# Patient Record
Sex: Female | Born: 1955
Health system: Southern US, Community
[De-identification: ages and names within clinical notes are randomized; demographics above are authoritative.]

## PROBLEM LIST (undated history)

## (undated) DIAGNOSIS — E785 Hyperlipidemia, unspecified: Secondary | ICD-10-CM

## (undated) DIAGNOSIS — F419 Anxiety disorder, unspecified: Secondary | ICD-10-CM

## (undated) DIAGNOSIS — H409 Unspecified glaucoma: Secondary | ICD-10-CM

## (undated) DIAGNOSIS — I10 Essential (primary) hypertension: Secondary | ICD-10-CM

## (undated) DIAGNOSIS — B019 Varicella without complication: Secondary | ICD-10-CM

## (undated) DIAGNOSIS — K259 Gastric ulcer, unspecified as acute or chronic, without hemorrhage or perforation: Secondary | ICD-10-CM

## (undated) DIAGNOSIS — Z9289 Personal history of other medical treatment: Secondary | ICD-10-CM

## (undated) HISTORY — DX: Personal history of other medical treatment: Z92.89

## (undated) HISTORY — DX: Unspecified glaucoma: H40.9

## (undated) HISTORY — DX: Essential (primary) hypertension: I10

## (undated) HISTORY — DX: Gastric ulcer, unspecified as acute or chronic, without hemorrhage or perforation: K25.9

## (undated) HISTORY — PX: PARTIAL HYSTERECTOMY: SHX80

## (undated) HISTORY — DX: Varicella without complication: B01.9

## (undated) HISTORY — DX: Hyperlipidemia, unspecified: E78.5

## (undated) HISTORY — DX: Anxiety disorder, unspecified: F41.9

---
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
## 1981-01-04 HISTORY — PX: WRIST GANGLION EXCISION: SUR520

## 2002-01-04 HISTORY — PX: VAGINAL HYSTERECTOMY: SUR661

## 2003-01-05 HISTORY — PX: HERNIA REPAIR: SHX51

## 2006-04-15 ENCOUNTER — Other Ambulatory Visit: Payer: Self-pay

## 2006-04-15 ENCOUNTER — Ambulatory Visit: Payer: Self-pay | Admitting: General Surgery

## 2006-04-21 ENCOUNTER — Ambulatory Visit: Payer: Self-pay | Admitting: General Surgery

## 2006-10-22 ENCOUNTER — Emergency Department: Payer: Self-pay | Admitting: Emergency Medicine

## 2008-10-13 ENCOUNTER — Inpatient Hospital Stay: Payer: Self-pay | Admitting: Internal Medicine

## 2009-03-18 ENCOUNTER — Ambulatory Visit: Payer: Self-pay | Admitting: Unknown Physician Specialty

## 2010-03-31 ENCOUNTER — Ambulatory Visit: Payer: Self-pay | Admitting: Family Medicine

## 2011-04-15 ENCOUNTER — Ambulatory Visit: Payer: Self-pay | Admitting: Family Medicine

## 2011-11-03 ENCOUNTER — Ambulatory Visit: Payer: Self-pay | Admitting: Family Medicine

## 2012-04-19 ENCOUNTER — Ambulatory Visit: Payer: Self-pay | Admitting: Family Medicine

## 2013-04-18 ENCOUNTER — Ambulatory Visit: Payer: Self-pay | Admitting: Family Medicine

## 2014-02-25 LAB — BASIC METABOLIC PANEL
BUN: 14 mg/dL (ref 4–21)
Creatinine: 0.9 mg/dL (ref 0.5–1.1)
Glucose: 100 mg/dL
Potassium: 4.2 mmol/L (ref 3.4–5.3)
SODIUM: 141 mmol/L (ref 137–147)

## 2014-02-25 LAB — CBC AND DIFFERENTIAL
HEMATOCRIT: 49 % — AB (ref 36–46)
HEMOGLOBIN: 16.6 g/dL — AB (ref 12.0–16.0)
NEUTROS ABS: 6 /uL
Platelets: 275 10*3/uL (ref 150–399)
WBC: 10.5 10^3/mL

## 2014-02-25 LAB — HEPATIC FUNCTION PANEL
ALT: 29 U/L (ref 7–35)
AST: 27 U/L (ref 13–35)
Alkaline Phosphatase: 78 U/L (ref 25–125)
Bilirubin, Total: 0.4 mg/dL

## 2014-02-25 LAB — LIPID PANEL
Cholesterol: 193 mg/dL (ref 0–200)
HDL: 60 mg/dL (ref 35–70)
LDL CALC: 101 mg/dL
Triglycerides: 158 mg/dL (ref 40–160)

## 2014-02-25 LAB — TSH: TSH: 0.88 u[IU]/mL (ref 0.41–5.90)

## 2014-02-25 LAB — HEMOGLOBIN A1C: Hemoglobin A1C: 6

## 2014-04-16 ENCOUNTER — Ambulatory Visit
Admit: 2014-04-16 | Disposition: A | Discharge status: Home / Self Care | Payer: Self-pay | Attending: Internal Medicine | Admitting: Internal Medicine

## 2014-04-16 LAB — CBC CANCER CENTER
BASOS ABS: 0.1 x10 3/mm (ref 0.0–0.1)
Basophil %: 0.9 %
Eosinophil #: 0.6 x10 3/mm (ref 0.0–0.7)
Eosinophil %: 4.1 %
HCT: 48.5 % — AB (ref 35.0–47.0)
HGB: 16.4 g/dL — ABNORMAL HIGH (ref 12.0–16.0)
Lymphocyte #: 4.7 x10 3/mm — ABNORMAL HIGH (ref 1.0–3.6)
Lymphocyte %: 34.5 %
MCH: 30.6 pg (ref 26.0–34.0)
MCHC: 33.8 g/dL (ref 32.0–36.0)
MCV: 90 fL (ref 80–100)
MONO ABS: 1.2 x10 3/mm — AB (ref 0.2–0.9)
Monocyte %: 8.4 %
NEUTROS PCT: 52.1 %
Neutrophil #: 7.2 x10 3/mm — ABNORMAL HIGH (ref 1.4–6.5)
PLATELETS: 269 x10 3/mm (ref 150–440)
RBC: 5.36 10*6/uL — AB (ref 3.80–5.20)
RDW: 12.5 % (ref 11.5–14.5)
WBC: 13.7 x10 3/mm — ABNORMAL HIGH (ref 3.6–11.0)

## 2014-04-16 LAB — FERRITIN: FERRITIN (ARMC): 156 ng/mL

## 2014-04-16 LAB — IRON AND TIBC
IRON BIND. CAP.(TOTAL): 477 — AB (ref 250–450)
IRON SATURATION: 14
Iron: 67 ug/dL
Unbound Iron-Bind.Cap.: 410

## 2014-04-23 LAB — CANCER CENTER HEMATOCRIT: HCT: 41.1 % (ref 35.0–47.0)

## 2014-04-24 ENCOUNTER — Ambulatory Visit
Admit: 2014-04-24 | Disposition: A | Discharge status: Home / Self Care | Payer: Self-pay | Attending: Nurse Practitioner | Admitting: Nurse Practitioner

## 2014-04-24 LAB — HM MAMMOGRAPHY: HM Mammogram: NORMAL (ref 0–4)

## 2015-03-20 ENCOUNTER — Encounter: Payer: Self-pay | Admitting: Family Medicine

## 2015-03-20 ENCOUNTER — Ambulatory Visit (INDEPENDENT_AMBULATORY_CARE_PROVIDER_SITE_OTHER): Payer: BLUE CROSS/BLUE SHIELD | Admitting: Family Medicine

## 2015-03-20 VITALS — BP 128/88 | HR 71 | Temp 97.8°F | Ht 61.0 in | Wt 137.2 lb

## 2015-03-20 DIAGNOSIS — Z9109 Other allergy status, other than to drugs and biological substances: Secondary | ICD-10-CM

## 2015-03-20 DIAGNOSIS — Z91048 Other nonmedicinal substance allergy status: Secondary | ICD-10-CM

## 2015-03-20 DIAGNOSIS — E785 Hyperlipidemia, unspecified: Secondary | ICD-10-CM

## 2015-03-20 DIAGNOSIS — I10 Essential (primary) hypertension: Secondary | ICD-10-CM

## 2015-03-20 DIAGNOSIS — K219 Gastro-esophageal reflux disease without esophagitis: Secondary | ICD-10-CM

## 2015-03-20 DIAGNOSIS — Z0000 Encounter for general adult medical examination without abnormal findings: Secondary | ICD-10-CM

## 2015-03-20 DIAGNOSIS — Z720 Tobacco use: Secondary | ICD-10-CM | POA: Diagnosis not present

## 2015-03-20 NOTE — Progress Notes (Signed)
Pre visit review using our clinic review tool, if applicable. No additional management support is needed unless otherwise documented below in the visit note. 

## 2015-03-20 NOTE — Patient Instructions (Signed)
It was nice to see you today.  Continue your current medications.  Consider cutting back on the smoking.  Please bring me a copy of your recent labs and mammogram.  We will set up the Cologuard.  Follow up:  6 months to 1 year.   Take care  Dr. Lacinda Axon  Health Maintenance, Female Adopting a healthy lifestyle and getting preventive care can go a long way to promote health and wellness. Talk with your health care provider about what schedule of regular examinations is right for you. This is a good chance for you to check in with your provider about disease prevention and staying healthy. In between checkups, there are plenty of things you can do on your own. Experts have done a lot of research about which lifestyle changes and preventive measures are most likely to keep you healthy. Ask your health care provider for more information. WEIGHT AND DIET  Eat a healthy diet  Be sure to include plenty of vegetables, fruits, low-fat dairy products, and lean protein.  Do not eat a lot of foods high in solid fats, added sugars, or salt.  Get regular exercise. This is one of the most important things you can do for your health.  Most adults should exercise for at least 150 minutes each week. The exercise should increase your heart rate and make you sweat (moderate-intensity exercise).  Most adults should also do strengthening exercises at least twice a week. This is in addition to the moderate-intensity exercise.  Maintain a healthy weight  Body mass index (BMI) is a measurement that can be used to identify possible weight problems. It estimates body fat based on height and weight. Your health care provider can help determine your BMI and help you achieve or maintain a healthy weight.  For females 35 years of age and older:   A BMI below 18.5 is considered underweight.  A BMI of 18.5 to 24.9 is normal.  A BMI of 25 to 29.9 is considered overweight.  A BMI of 30 and above is considered  obese.  Watch levels of cholesterol and blood lipids  You should start having your blood tested for lipids and cholesterol at 58 years of age, then have this test every 5 years.  You may need to have your cholesterol levels checked more often if:  Your lipid or cholesterol levels are high.  You are older than 58 years of age.  You are at high risk for heart disease.  CANCER SCREENING   Lung Cancer  Lung cancer screening is recommended for adults 75-55 years old who are at high risk for lung cancer because of a history of smoking.  A yearly low-dose CT scan of the lungs is recommended for people who:  Currently smoke.  Have quit within the past 15 years.  Have at least a 30-pack-year history of smoking. A pack year is smoking an average of one pack of cigarettes a day for 1 year.  Yearly screening should continue until it has been 15 years since you quit.  Yearly screening should stop if you develop a health problem that would prevent you from having lung cancer treatment.  Breast Cancer  Practice breast self-awareness. This means understanding how your breasts normally appear and feel.  It also means doing regular breast self-exams. Let your health care provider know about any changes, no matter how small.  If you are in your 20s or 30s, you should have a clinical breast exam (CBE) by a health  care provider every 1-3 years as part of a regular health exam.  If you are 34 or older, have a CBE every year. Also consider having a breast X-ray (mammogram) every year.  If you have a family history of breast cancer, talk to your health care provider about genetic screening.  If you are at high risk for breast cancer, talk to your health care provider about having an MRI and a mammogram every year.  Breast cancer gene (BRCA) assessment is recommended for women who have family members with BRCA-related cancers. BRCA-related cancers  include:  Breast.  Ovarian.  Tubal.  Peritoneal cancers.  Results of the assessment will determine the need for genetic counseling and BRCA1 and BRCA2 testing. Cervical Cancer Your health care provider may recommend that you be screened regularly for cancer of the pelvic organs (ovaries, uterus, and vagina). This screening involves a pelvic examination, including checking for microscopic changes to the surface of your cervix (Pap test). You may be encouraged to have this screening done every 3 years, beginning at age 21.  For women ages 89-65, health care providers may recommend pelvic exams and Pap testing every 3 years, or they may recommend the Pap and pelvic exam, combined with testing for human papilloma virus (HPV), every 5 years. Some types of HPV increase your risk of cervical cancer. Testing for HPV may also be done on women of any age with unclear Pap test results.  Other health care providers may not recommend any screening for nonpregnant women who are considered low risk for pelvic cancer and who do not have symptoms. Ask your health care provider if a screening pelvic exam is right for you.  If you have had past treatment for cervical cancer or a condition that could lead to cancer, you need Pap tests and screening for cancer for at least 20 years after your treatment. If Pap tests have been discontinued, your risk factors (such as having a new sexual partner) need to be reassessed to determine if screening should resume. Some women have medical problems that increase the chance of getting cervical cancer. In these cases, your health care provider may recommend more frequent screening and Pap tests. Colorectal Cancer  This type of cancer can be detected and often prevented.  Routine colorectal cancer screening usually begins at 59 years of age and continues through 58 years of age.  Your health care provider may recommend screening at an earlier age if you have risk factors for  colon cancer.  Your health care provider may also recommend using home test kits to check for hidden blood in the stool.  A small camera at the end of a tube can be used to examine your colon directly (sigmoidoscopy or colonoscopy). This is done to check for the earliest forms of colorectal cancer.  Routine screening usually begins at age 36.  Direct examination of the colon should be repeated every 5-10 years through 58 years of age. However, you may need to be screened more often if early forms of precancerous polyps or small growths are found. Skin Cancer  Check your skin from head to toe regularly.  Tell your health care provider about any new moles or changes in moles, especially if there is a change in a mole's shape or color.  Also tell your health care provider if you have a mole that is larger than the size of a pencil eraser.  Always use sunscreen. Apply sunscreen liberally and repeatedly throughout the day.  Protect  yourself by wearing long sleeves, pants, a wide-brimmed hat, and sunglasses whenever you are outside. HEART DISEASE, DIABETES, AND HIGH BLOOD PRESSURE   High blood pressure causes heart disease and increases the risk of stroke. High blood pressure is more likely to develop in:  People who have blood pressure in the high end of the normal range (130-139/85-89 mm Hg).  People who are overweight or obese.  People who are African American.  If you are 54-65 years of age, have your blood pressure checked every 3-5 years. If you are 81 years of age or older, have your blood pressure checked every year. You should have your blood pressure measured twice--once when you are at a hospital or clinic, and once when you are not at a hospital or clinic. Record the average of the two measurements. To check your blood pressure when you are not at a hospital or clinic, you can use:  An automated blood pressure machine at a pharmacy.  A home blood pressure monitor.  If you  are between 81 years and 18 years old, ask your health care provider if you should take aspirin to prevent strokes.  Have regular diabetes screenings. This involves taking a blood sample to check your fasting blood sugar level.  If you are at a normal weight and have a low risk for diabetes, have this test once every three years after 58 years of age.  If you are overweight and have a high risk for diabetes, consider being tested at a younger age or more often. PREVENTING INFECTION  Hepatitis B  If you have a higher risk for hepatitis B, you should be screened for this virus. You are considered at high risk for hepatitis B if:  You were born in a country where hepatitis B is common. Ask your health care provider which countries are considered high risk.  Your parents were born in a high-risk country, and you have not been immunized against hepatitis B (hepatitis B vaccine).  You have HIV or AIDS.  You use needles to inject street drugs.  You live with someone who has hepatitis B.  You have had sex with someone who has hepatitis B.  You get hemodialysis treatment.  You take certain medicines for conditions, including cancer, organ transplantation, and autoimmune conditions. Hepatitis C  Blood testing is recommended for:  Everyone born from [birth year] through 1963.  Anyone with known risk factors for hepatitis C. Sexually transmitted infections (STIs)  You should be screened for sexually transmitted infections (STIs) including gonorrhea and chlamydia if:  You are sexually active and are younger than 58 years of age.  You are older than 58 years of age and your health care provider tells you that you are at risk for this type of infection.  Your sexual activity has changed since you were last screened and you are at an increased risk for chlamydia or gonorrhea. Ask your health care provider if you are at risk.  If you do not have HIV, but are at risk, it may be recommended that you  take a prescription medicine daily to prevent HIV infection. This is called pre-exposure prophylaxis (PrEP). You are considered at risk if:  You are sexually active and do not regularly use condoms or know the HIV status of your partner(s).  You take drugs by injection.  You are sexually active with a partner who has HIV. Talk with your health care provider about whether you are at high risk of being infected with  HIV. If you choose to begin PrEP, you should first be tested for HIV. You should then be tested every 3 months for as long as you are taking PrEP.  PREGNANCY   If you are premenopausal and you may become pregnant, ask your health care provider about preconception counseling.  If you may become pregnant, take 400 to 800 micrograms (mcg) of folic acid every day.  If you want to prevent pregnancy, talk to your health care provider about birth control (contraception). OSTEOPOROSIS AND MENOPAUSE   Osteoporosis is a disease in which the bones lose minerals and strength with aging. This can result in serious bone fractures. Your risk for osteoporosis can be identified using a bone density scan.  If you are 6 years of age or older, or if you are at risk for osteoporosis and fractures, ask your health care provider if you should be screened.  Ask your health care provider whether you should take a calcium or vitamin D supplement to lower your risk for osteoporosis.  Menopause may have certain physical symptoms and risks.  Hormone replacement therapy may reduce some of these symptoms and risks. Talk to your health care provider about whether hormone replacement therapy is right for you.  HOME CARE INSTRUCTIONS   Schedule regular health, dental, and eye exams.  Stay current with your immunizations.   Do not use any tobacco products including cigarettes, chewing tobacco, or electronic cigarettes.  If you are pregnant, do not drink alcohol.  If you are breastfeeding, limit how  much and how often you drink alcohol.  Limit alcohol intake to no more than 1 drink per day for nonpregnant women. One drink equals 12 ounces of beer, 5 ounces of wine, or 1 ounces of hard liquor.  Do not use street drugs.  Do not share needles.  Ask your health care provider for help if you need support or information about quitting drugs.  Tell your health care provider if you often feel depressed.  Tell your health care provider if you have ever been abused or do not feel safe at home.   This information is not intended to replace advice given to you by your health care provider. Make sure you discuss any questions you have with your health care provider.   Document Released: 07/06/2010 Document Revised: 01/11/2014 Document Reviewed: 11/22/2012 Elsevier Interactive Patient Education [employer].

## 2015-03-21 ENCOUNTER — Encounter: Payer: Self-pay | Admitting: Family Medicine

## 2015-03-21 DIAGNOSIS — K219 Gastro-esophageal reflux disease without esophagitis: Secondary | ICD-10-CM | POA: Insufficient documentation

## 2015-03-21 DIAGNOSIS — Z0000 Encounter for general adult medical examination without abnormal findings: Secondary | ICD-10-CM | POA: Insufficient documentation

## 2015-03-21 DIAGNOSIS — Z9109 Other allergy status, other than to drugs and biological substances: Secondary | ICD-10-CM | POA: Insufficient documentation

## 2015-03-21 DIAGNOSIS — E785 Hyperlipidemia, unspecified: Secondary | ICD-10-CM | POA: Insufficient documentation

## 2015-03-21 DIAGNOSIS — I10 Essential (primary) hypertension: Secondary | ICD-10-CM | POA: Insufficient documentation

## 2015-03-21 DIAGNOSIS — Z720 Tobacco use: Secondary | ICD-10-CM | POA: Insufficient documentation

## 2015-03-21 NOTE — Progress Notes (Signed)
Subjective:  Patient ID: Carolyn Owens, female    DOB: May 14, 1955  Age: 60 y.o. MRN: [id number]  CC: Establish care  HPI Carolyn Owens is a 60 y.o. female presents to the clinic today to establish care.  Preventative Healthcare  Pap smear: No longer needed; s/p hysterectomy for benign cause  Mammogram: UTD. 4/16.   Colonoscopy: In need of. Will discuss today.  Immunizations  Tetanus - Up to date.   Pneumococcal - Has had PCV 23 (2015).   Flu - Up to date.  Hepatitis C screening - In need of.  Labs: Had labs in 2016.  Exercise: No regular exercise.   Alcohol use: See below.   Smoking/tobacco use: Current smoker.   PMH, Surgical Hx, Family Hx, Social History reviewed and updated as below.  Past Medical History  Diagnosis Date  . Chicken pox   . Glaucoma     suspect patient  . History of blood transfusion   . Hypertension   . Hyperlipidemia   . Multiple gastric ulcers    Past Surgical History  Procedure Laterality Date  . Vaginal hysterectomy  2004    Has ovaries (no oophorectomy).  . Ankle bone infection Right 1965  . Hernia repair  2005  . Wrist ganglion excision Right 1983   Family History  Problem Relation Age of Onset  . Lung cancer Father   . Hyperlipidemia Mother   . Hyperlipidemia Father   . Hypertension Mother   . Hypertension Father   . Pancreatic cancer Mother    Social History  Substance Use Topics  . Smoking status: Current Every Day Smoker  . Smokeless tobacco: Never Used  . Alcohol Use: 0.6 - 1.2 oz/week    1-2 Standard drinks or equivalent per week   Review of Systems Complete ROS was obtained today and was negative. See scanned document. Objective:   Today's Vitals: BP 128/88 mmHg  Pulse 71  Temp(Src) 97.8 F (36.6 C) (Oral)  Ht 5\' 1"  (1.549 m)  Wt 137 lb 4 oz (62.256 kg)  BMI 25.95 kg/m2  SpO2 91%  Physical Exam  Constitutional: She is oriented to person, place, and time. She appears well-developed and well-nourished.  No distress.  HENT:  Head: Normocephalic and atraumatic.  Mouth/Throat: Oropharynx is clear and moist. No oropharyngeal exudate.  Normal TM's bilaterally.   Eyes: Conjunctivae are normal. No scleral icterus.  Neck: Neck supple.  Cardiovascular: Normal rate and regular rhythm.   Pulmonary/Chest: Effort normal and breath sounds normal. She has no wheezes. She has no rales.  Abdominal: Soft. She exhibits no distension. There is no tenderness. There is no rebound and no guarding.  Musculoskeletal: Normal range of motion. She exhibits no edema.  Lymphadenopathy:    She has no cervical adenopathy.  Neurological: She is alert and oriented to person, place, and time.  Skin: Skin is warm and dry. No rash noted.  Psychiatric: She has a normal mood and affect.  Vitals reviewed.  Assessment & Plan:   Problem List Items Addressed This Visit    Tobacco abuse   Preventative health care - Primary    Pap smear no longer needed. Will need mammogram later this year.  Declines colonoscopy. Will arrange cologuard. Will await records regarding labs. Immunizations up to date.       Hyperlipidemia   Relevant Medications   simvastatin (ZOCOR) 40 MG tablet   valsartan-hydrochlorothiazide (DIOVAN-HCT) 160-12.5 MG tablet   HTN (hypertension)   Relevant Medications   simvastatin (ZOCOR)  40 MG tablet   valsartan-hydrochlorothiazide (DIOVAN-HCT) 160-12.5 MG tablet   GERD (gastroesophageal reflux disease)   Relevant Medications   omeprazole (PRILOSEC) 20 MG capsule   Environmental allergies     Outpatient Encounter Prescriptions as of 03/20/2015  Medication Sig  . cetirizine (ZYRTEC) 10 MG tablet Take 10 mg by mouth daily.  . Cholecalciferol (D3 ADULT PO) Take 1,000 Units by mouth.  . fluticasone (FLONASE) 50 MCG/ACT nasal spray SPRAY 2 SPRAYS IN EACH NOSTRIL EVERY DAY  . Multiple Vitamin (MULTIVITAMIN) tablet Take 1 tablet by mouth daily.  . Omega-3 Fatty Acids (FISH OIL PO) Take 1,400 mg by mouth.   Marland Kitchen omeprazole (PRILOSEC) 20 MG capsule Take 20 mg by mouth 2 (two) times daily.  Marland Kitchen PROAIR HFA 108 (90 Base) MCG/ACT inhaler Place 2 puffs into the nose as needed.  . sertraline (ZOLOFT) 50 MG tablet Take 50 mg by mouth daily.  . simvastatin (ZOCOR) 40 MG tablet TAKE 1 TABLET (40 MG) BY ORAL ROUTE ONCE DAILY IN THE EVENING  . valsartan-hydrochlorothiazide (DIOVAN-HCT) 160-12.5 MG tablet Take 1 tablet by mouth daily.   No facility-administered encounter medications on file as of 03/20/2015.    Follow-up: 6 months - 1 year  Everlene Other DO [facility]

## 2015-03-21 NOTE — Assessment & Plan Note (Signed)
Pap smear no longer needed. Will need mammogram later this year.  Declines colonoscopy. Will arrange cologuard. Will await records regarding labs. Immunizations up to date.

## 2015-04-21 ENCOUNTER — Encounter: Payer: Self-pay | Admitting: Family Medicine

## 2015-05-06 DIAGNOSIS — Z1231 Encounter for screening mammogram for malignant neoplasm of breast: Secondary | ICD-10-CM | POA: Diagnosis not present

## 2015-05-26 ENCOUNTER — Other Ambulatory Visit: Payer: Self-pay | Admitting: Family Medicine

## 2015-05-26 DIAGNOSIS — R229 Localized swelling, mass and lump, unspecified: Secondary | ICD-10-CM

## 2015-06-11 ENCOUNTER — Other Ambulatory Visit: Payer: Self-pay | Admitting: *Deleted

## 2015-06-11 ENCOUNTER — Inpatient Hospital Stay
Admission: RE | Admit: 2015-06-11 | Discharge: 2015-06-11 | Disposition: A | Discharge status: Home / Self Care | Payer: Self-pay | Source: Ambulatory Visit | Attending: *Deleted | Admitting: *Deleted

## 2015-06-11 DIAGNOSIS — Z9289 Personal history of other medical treatment: Secondary | ICD-10-CM

## 2015-06-18 ENCOUNTER — Ambulatory Visit
Admission: RE | Admit: 2015-06-18 | Discharge: 2015-06-18 | Disposition: A | Discharge status: Home / Self Care | Payer: BLUE CROSS/BLUE SHIELD | Source: Ambulatory Visit | Attending: Family Medicine | Admitting: Family Medicine

## 2015-06-18 DIAGNOSIS — N63 Unspecified lump in breast: Secondary | ICD-10-CM | POA: Insufficient documentation

## 2015-06-18 DIAGNOSIS — R229 Localized swelling, mass and lump, unspecified: Secondary | ICD-10-CM | POA: Diagnosis present

## 2015-07-22 DIAGNOSIS — H40003 Preglaucoma, unspecified, bilateral: Secondary | ICD-10-CM | POA: Diagnosis not present

## 2015-09-12 DIAGNOSIS — L281 Prurigo nodularis: Secondary | ICD-10-CM | POA: Diagnosis not present

## 2015-12-01 DIAGNOSIS — M7541 Impingement syndrome of right shoulder: Secondary | ICD-10-CM | POA: Diagnosis not present

## 2016-01-20 DIAGNOSIS — H40003 Preglaucoma, unspecified, bilateral: Secondary | ICD-10-CM | POA: Diagnosis not present

## 2016-01-27 DIAGNOSIS — H40003 Preglaucoma, unspecified, bilateral: Secondary | ICD-10-CM | POA: Diagnosis not present

## 2016-02-25 DIAGNOSIS — D225 Melanocytic nevi of trunk: Secondary | ICD-10-CM | POA: Diagnosis not present

## 2016-02-25 DIAGNOSIS — Z85828 Personal history of other malignant neoplasm of skin: Secondary | ICD-10-CM | POA: Diagnosis not present

## 2016-02-25 DIAGNOSIS — D485 Neoplasm of uncertain behavior of skin: Secondary | ICD-10-CM | POA: Diagnosis not present

## 2016-05-24 ENCOUNTER — Other Ambulatory Visit: Payer: Self-pay | Admitting: Family Medicine

## 2016-05-24 DIAGNOSIS — Z1231 Encounter for screening mammogram for malignant neoplasm of breast: Secondary | ICD-10-CM

## 2016-06-08 ENCOUNTER — Ambulatory Visit
Admission: RE | Admit: 2016-06-08 | Discharge: 2016-06-08 | Disposition: A | Discharge status: Home / Self Care | Payer: BLUE CROSS/BLUE SHIELD | Source: Ambulatory Visit | Attending: Family Medicine | Admitting: Family Medicine

## 2016-06-08 DIAGNOSIS — Z1231 Encounter for screening mammogram for malignant neoplasm of breast: Secondary | ICD-10-CM | POA: Diagnosis not present

## 2016-07-26 DIAGNOSIS — H40003 Preglaucoma, unspecified, bilateral: Secondary | ICD-10-CM | POA: Diagnosis not present

## 2016-11-28 ENCOUNTER — Inpatient Hospital Stay
Admission: EM | Admit: 2016-11-28 | Discharge: 2016-12-03 | DRG: 190 | Disposition: A | Discharge status: Home / Self Care | Payer: BLUE CROSS/BLUE SHIELD | Attending: Internal Medicine | Admitting: Internal Medicine

## 2016-11-28 DIAGNOSIS — J189 Pneumonia, unspecified organism: Secondary | ICD-10-CM | POA: Diagnosis not present

## 2016-11-28 DIAGNOSIS — I10 Essential (primary) hypertension: Secondary | ICD-10-CM | POA: Diagnosis not present

## 2016-11-28 DIAGNOSIS — R0902 Hypoxemia: Secondary | ICD-10-CM | POA: Diagnosis not present

## 2016-11-28 DIAGNOSIS — Z8711 Personal history of peptic ulcer disease: Secondary | ICD-10-CM

## 2016-11-28 DIAGNOSIS — J40 Bronchitis, not specified as acute or chronic: Secondary | ICD-10-CM | POA: Diagnosis not present

## 2016-11-28 DIAGNOSIS — E876 Hypokalemia: Secondary | ICD-10-CM | POA: Diagnosis not present

## 2016-11-28 DIAGNOSIS — H409 Unspecified glaucoma: Secondary | ICD-10-CM | POA: Diagnosis present

## 2016-11-28 DIAGNOSIS — R0602 Shortness of breath: Secondary | ICD-10-CM

## 2016-11-28 DIAGNOSIS — F17200 Nicotine dependence, unspecified, uncomplicated: Secondary | ICD-10-CM | POA: Diagnosis not present

## 2016-11-28 DIAGNOSIS — A419 Sepsis, unspecified organism: Secondary | ICD-10-CM

## 2016-11-28 DIAGNOSIS — E785 Hyperlipidemia, unspecified: Secondary | ICD-10-CM | POA: Diagnosis not present

## 2016-11-28 DIAGNOSIS — J449 Chronic obstructive pulmonary disease, unspecified: Secondary | ICD-10-CM | POA: Diagnosis present

## 2016-11-28 DIAGNOSIS — Z90710 Acquired absence of both cervix and uterus: Secondary | ICD-10-CM | POA: Diagnosis not present

## 2016-11-28 DIAGNOSIS — Z79899 Other long term (current) drug therapy: Secondary | ICD-10-CM

## 2016-11-28 DIAGNOSIS — J9601 Acute respiratory failure with hypoxia: Secondary | ICD-10-CM | POA: Diagnosis present

## 2016-11-28 DIAGNOSIS — E871 Hypo-osmolality and hyponatremia: Secondary | ICD-10-CM | POA: Diagnosis not present

## 2016-11-28 DIAGNOSIS — J441 Chronic obstructive pulmonary disease with (acute) exacerbation: Secondary | ICD-10-CM | POA: Diagnosis not present

## 2016-11-28 DIAGNOSIS — R0600 Dyspnea, unspecified: Secondary | ICD-10-CM | POA: Diagnosis not present

## 2016-11-28 DIAGNOSIS — R480 Dyslexia and alexia: Secondary | ICD-10-CM | POA: Diagnosis not present

## 2016-11-28 DIAGNOSIS — F419 Anxiety disorder, unspecified: Secondary | ICD-10-CM | POA: Diagnosis present

## 2016-11-28 NOTE — ED Provider Notes (Signed)
[facility] Emergency Department Provider Note   ____________________________________________   First MD Initiated Contact with Patient 11/28/16 2356     (approximate)  I have reviewed the triage vital signs and the nursing notes.   HISTORY  Chief Complaint Shortness of Breath    HPI Carolyn Owens is a 61 y.o. female brought to the ED from home via EMS with a chief complaint of shortness of breath.  Patient reports productive cough, fever, chills and shortness of breath over the past 2 days.  EMS reports patient with room air saturations 87% upon their arrival with oral temperature above 100 F.  She was administered an albuterol nebulizer as well as a DuoNeb in route to the ED for wheezing and rhonchi with improvement.  Denies associated chest pain, abdominal pain, nausea or vomiting.  Denies recent travel or trauma.   Past Medical History:  Diagnosis Date  . Anxiety   . Chicken pox   . Glaucoma    suspect patient  . History of blood transfusion   . Hyperlipidemia   . Hypertension   . Multiple gastric ulcers     Patient Active Problem List   Diagnosis Date Noted  . HTN (hypertension) 03/21/2015  . Hyperlipidemia 03/21/2015  . Tobacco abuse 03/21/2015  . Environmental allergies 03/21/2015  . GERD (gastroesophageal reflux disease) 03/21/2015  . Preventative health care 03/21/2015    Past Surgical History:  Procedure Laterality Date  . ankle bone infection Right 1965  . HERNIA REPAIR  2005  . PARTIAL HYSTERECTOMY  2004/2005  . VAGINAL HYSTERECTOMY  2004   Has ovaries (no oophorectomy).  . WRIST GANGLION EXCISION Right 1983    Prior to Admission medications   Medication Sig Start Date End Date Taking? Authorizing Provider  cetirizine (ZYRTEC) 10 MG tablet Take 10 mg by mouth daily. 02/20/15  Yes [provider]  Cholecalciferol (D3 ADULT PO) Take 1,000 Units by mouth.   Yes [provider]  fluticasone (FLONASE) 50  MCG/ACT nasal spray SPRAY 2 SPRAYS IN EACH NOSTRIL EVERY DAY 02/20/15  Yes [provider]  Multiple Vitamin (MULTIVITAMIN) tablet Take 1 tablet by mouth daily.   Yes [provider]  Omega-3 Fatty Acids (FISH OIL PO) Take 1,400 mg by mouth.   Yes [provider]  omeprazole (PRILOSEC) 20 MG capsule Take 20 mg by mouth 2 (two) times daily. 02/20/15  Yes [provider]  PROAIR HFA 108 (90 Base) MCG/ACT inhaler Place 2 puffs into the nose as needed. 02/20/15  Yes [provider]  simvastatin (ZOCOR) 40 MG tablet TAKE 1 TABLET (40 MG) BY ORAL ROUTE ONCE DAILY IN THE EVENING 12/25/14  Yes [provider]  valsartan-hydrochlorothiazide (DIOVAN-HCT) 160-12.5 MG tablet Take 1 tablet by mouth daily. 02/20/15  Yes [provider]  sertraline (ZOLOFT) 50 MG tablet Take 50 mg by mouth daily. 02/17/15   [provider]    Allergies Adhesive [tape]; Bc powder [aspirin-salicylamide-caffeine]; and Sulfur  Family History  Problem Relation Age of Onset  . Lung cancer Father   . Hyperlipidemia Father   . Hypertension Father   . Hyperlipidemia Mother   . Hypertension Mother   . Pancreatic cancer Mother   . Diabetes Mother   . Multiple myeloma Paternal Grandfather   . Liver cancer Maternal Aunt   . Breast cancer Paternal Aunt   . Brain cancer Maternal Uncle     Social History Social History   Tobacco Use  . Smoking status:  Current Every Day Smoker  . Smokeless tobacco: Never Used  Substance Use Topics  . Alcohol use: Yes    Alcohol/week: 0.6 - 1.2 oz    Types: 1 - 2 Standard drinks or equivalent per week  . Drug use: Not on file    Review of Systems  Constitutional: Positive for fever/chills. Eyes: No visual changes. ENT: No sore throat. Cardiovascular: Denies chest pain. Respiratory: Positive for productive cough and shortness of breath. Gastrointestinal: No abdominal pain.  No nausea, no vomiting.  No diarrhea.  No  constipation. Genitourinary: Negative for dysuria. Musculoskeletal: Negative for back pain. Skin: Negative for rash. Neurological: Negative for headaches, focal weakness or numbness.   ____________________________________________   PHYSICAL EXAM:  VITAL SIGNS: ED Triage Vitals  Enc Vitals Group     BP      Pulse      Resp      Temp      Temp src      SpO2      Weight      Height      Head Circumference      Peak Flow      Pain Score      Pain Loc      Pain Edu?      Excl. in [place]?     Constitutional: Alert and oriented. Ill- appearing and in moderate acute distress. Eyes: Conjunctivae are normal. PERRL. EOMI. Head: Atraumatic. Nose: Congestion/rhinnorhea. Mouth/Throat: Mucous membranes are moist.  Oropharynx non-erythematous. Neck: No stridor.   Cardiovascular: Tachycardic rate, regular rhythm. Grossly normal heart sounds.  Good peripheral circulation. Respiratory: Increased respiratory effort.  Retractions. Lungs with scattered rhonchi and wheezing. Gastrointestinal: Soft and nontender. No distention. No abdominal bruits. No CVA tenderness. Musculoskeletal: No lower extremity tenderness nor edema.  No joint effusions. Neurologic:  Normal speech and language. No gross focal neurologic deficits are appreciated.  Skin:  Skin is hot, dry and intact. No rash noted.  No petechiae. Psychiatric: Mood and affect are normal. Speech and behavior are normal.  ____________________________________________   LABS (all labs ordered are listed, but only abnormal results are displayed)  Labs Reviewed  CBC WITH DIFFERENTIAL/PLATELET - Abnormal; Notable for the following components:      Result Value   WBC 14.1 (*)    Neutro Abs 11.3 (*)    Monocytes Absolute 1.1 (*)    All other components within normal limits  COMPREHENSIVE METABOLIC PANEL - Abnormal; Notable for the following components:   Potassium 2.5 (*)    Chloride 96 (*)    Glucose, Bld 186 (*)    AST 43 (*)    All  other components within normal limits  BLOOD GAS, ARTERIAL - Abnormal; Notable for the following components:   pH, Arterial 7.46 (*)    pO2, Arterial 49 (*)    Bicarbonate 28.4 (*)    Acid-Base Excess 4.2 (*)    All other components within normal limits  CULTURE, BLOOD (ROUTINE X 2)  CULTURE, BLOOD (ROUTINE X 2)  URINE CULTURE  TROPONIN I  LACTIC ACID, PLASMA  LACTIC ACID, PLASMA  URINALYSIS, ROUTINE W REFLEX MICROSCOPIC  INFLUENZA PANEL BY PCR (TYPE A & B)   ____________________________________________  EKG  ED ECG REPORT I, Kashlyn Salinas J, the attending physician, personally viewed and interpreted this ECG.   Date: 11/29/2016  EKG Time: 0021  Rate: 115  Rhythm: sinus tachycardia  Axis: Normal  Intervals:left posterior fascicular block  ST&T Change: Nonspecific  ____________________________________________  RADIOLOGY  Dg  Chest Port 1 View  Result Date: 11/29/2016 CLINICAL DATA:  Shortness of breath EXAM: PORTABLE CHEST 1 VIEW COMPARISON:  11/03/2011 ,10/10/ 2010 FINDINGS: Similar appearance of bibasilar interstitial opacities suggesting chronic change. No acute consolidation or pleural effusion. Stable cardiomediastinal silhouette with atherosclerosis. No pneumothorax. IMPRESSION: No active disease. Stable chronic interstitial opacities at both lung bases. Electronically Signed   By: Donavan Foil M.D.   On: 11/29/2016 00:41    ____________________________________________   PROCEDURES  Procedure(s) performed: None  Procedures  Critical Care performed: Yes, see critical care note(s)   CRITICAL CARE Performed by: Paulette Blanch   Total critical care time: 45 minutes  Critical care time was exclusive of separately billable procedures and treating other patients.  Critical care was necessary to treat or prevent imminent or life-threatening deterioration.  Critical care was time spent personally by me on the following activities: development of treatment plan with  patient and/or surrogate as well as nursing, discussions with consultants, evaluation of patient's response to treatment, examination of patient, obtaining history from patient or surrogate, ordering and performing treatments and interventions, ordering and review of laboratory studies, ordering and review of radiographic studies, pulse oximetry and re-evaluation of patient's condition.  ____________________________________________   INITIAL IMPRESSION / ASSESSMENT AND PLAN / ED COURSE  As part of my medical decision making, I reviewed the following data within the [place] notes reviewed and incorporated, Labs reviewed, EKG interpreted, Radiograph reviewed, Discussed with admitting physician and Notes from prior ED visits.   61 year old female presenting with fever, productive cough and shortness of breath. Differential includes, but is not limited to, viral syndrome, bronchitis including COPD exacerbation, pneumonia, reactive airway disease including asthma, CHF including exacerbation with or without pulmonary/interstitial edema, pneumothorax, ACS, thoracic trauma, and pulmonary embolism.  ED code sepsis initiated immediately upon patient's arrival to the treatment room.  Suspect community-acquired pneumonia.  Initiate IV fluid resuscitation, nebulizer treatment, IV Solu-Medrol, IV antibiotics.  Anticipate hospitalization.  Clinical Course as of Nov 30 126  Mon Nov 29, 2016  0101 Updated patient's on laboratory and imaging results.  IV potassium ordered.  Based on patient's blood gas on 5 L nasal cannula oxygen, will place her on BiPAP.  Will discuss with hospitalist Dr. Jerelyn Charles to evaluate patient in the emergency department for admission.  [JS]    Clinical Course User Index [JS] Paulette Blanch, MD     ____________________________________________   FINAL CLINICAL IMPRESSION(S) / ED DIAGNOSES  Final diagnoses:  Hypoxia  SOB (shortness of breath)  Community  acquired pneumonia, unspecified laterality  Sepsis, due to unspecified organism [facility])  Hypokalemia     ED Discharge Orders    None       Note:  This document was prepared using Dragon voice recognition software and may include unintentional dictation errors.    Paulette Blanch, MD 11/29/16 [phone]

## 2016-11-28 NOTE — ED Triage Notes (Signed)
Pt presents w/ c/o shortness of breath progressing over past 2 days. EMS reports pt 87% SaO2 upon arrival. Pt administered duoneb and albuterol neb, w/ improvement. EMS reports wheezing and rhonchi upon arrival.

## 2016-11-29 ENCOUNTER — Emergency Department: Payer: BLUE CROSS/BLUE SHIELD

## 2016-11-29 ENCOUNTER — Encounter: Payer: Self-pay | Admitting: *Deleted

## 2016-11-29 ENCOUNTER — Other Ambulatory Visit: Payer: Self-pay

## 2016-11-29 DIAGNOSIS — J449 Chronic obstructive pulmonary disease, unspecified: Secondary | ICD-10-CM | POA: Diagnosis not present

## 2016-11-29 DIAGNOSIS — F419 Anxiety disorder, unspecified: Secondary | ICD-10-CM | POA: Diagnosis present

## 2016-11-29 DIAGNOSIS — J9601 Acute respiratory failure with hypoxia: Secondary | ICD-10-CM

## 2016-11-29 DIAGNOSIS — F17200 Nicotine dependence, unspecified, uncomplicated: Secondary | ICD-10-CM | POA: Diagnosis not present

## 2016-11-29 DIAGNOSIS — R480 Dyslexia and alexia: Secondary | ICD-10-CM | POA: Diagnosis not present

## 2016-11-29 DIAGNOSIS — Z90710 Acquired absence of both cervix and uterus: Secondary | ICD-10-CM | POA: Diagnosis not present

## 2016-11-29 DIAGNOSIS — J441 Chronic obstructive pulmonary disease with (acute) exacerbation: Secondary | ICD-10-CM | POA: Diagnosis not present

## 2016-11-29 DIAGNOSIS — E871 Hypo-osmolality and hyponatremia: Secondary | ICD-10-CM | POA: Diagnosis present

## 2016-11-29 DIAGNOSIS — E785 Hyperlipidemia, unspecified: Secondary | ICD-10-CM | POA: Diagnosis not present

## 2016-11-29 DIAGNOSIS — Z8711 Personal history of peptic ulcer disease: Secondary | ICD-10-CM | POA: Diagnosis not present

## 2016-11-29 DIAGNOSIS — R0602 Shortness of breath: Secondary | ICD-10-CM | POA: Diagnosis not present

## 2016-11-29 DIAGNOSIS — E876 Hypokalemia: Secondary | ICD-10-CM | POA: Diagnosis not present

## 2016-11-29 DIAGNOSIS — H409 Unspecified glaucoma: Secondary | ICD-10-CM | POA: Diagnosis present

## 2016-11-29 DIAGNOSIS — J40 Bronchitis, not specified as acute or chronic: Secondary | ICD-10-CM | POA: Diagnosis present

## 2016-11-29 DIAGNOSIS — I10 Essential (primary) hypertension: Secondary | ICD-10-CM | POA: Diagnosis not present

## 2016-11-29 DIAGNOSIS — Z79899 Other long term (current) drug therapy: Secondary | ICD-10-CM | POA: Diagnosis not present

## 2016-11-29 LAB — CBC WITH DIFFERENTIAL/PLATELET
BASOS ABS: 0 10*3/uL (ref 0–0.1)
Basophils Relative: 0 %
EOS PCT: 0 %
Eosinophils Absolute: 0 10*3/uL (ref 0–0.7)
HEMATOCRIT: 43.5 % (ref 35.0–47.0)
Hemoglobin: 14.8 g/dL (ref 12.0–16.0)
LYMPHS ABS: 1.6 10*3/uL (ref 1.0–3.6)
LYMPHS PCT: 11 %
MCH: 30.7 pg (ref 26.0–34.0)
MCHC: 34.1 g/dL (ref 32.0–36.0)
MCV: 90.1 fL (ref 80.0–100.0)
MONO ABS: 1.1 10*3/uL — AB (ref 0.2–0.9)
Monocytes Relative: 8 %
NEUTROS ABS: 11.3 10*3/uL — AB (ref 1.4–6.5)
Neutrophils Relative %: 81 %
Platelets: 214 10*3/uL (ref 150–440)
RBC: 4.83 MIL/uL (ref 3.80–5.20)
RDW: 12.6 % (ref 11.5–14.5)
WBC: 14.1 10*3/uL — ABNORMAL HIGH (ref 3.6–11.0)

## 2016-11-29 LAB — URINALYSIS, ROUTINE W REFLEX MICROSCOPIC
BACTERIA UA: NONE SEEN
BILIRUBIN URINE: NEGATIVE
Glucose, UA: NEGATIVE mg/dL
Ketones, ur: NEGATIVE mg/dL
Leukocytes, UA: NEGATIVE
Nitrite: NEGATIVE
Protein, ur: 100 mg/dL — AB
SPECIFIC GRAVITY, URINE: 1.024 (ref 1.005–1.030)
pH: 5 (ref 5.0–8.0)

## 2016-11-29 LAB — COMPREHENSIVE METABOLIC PANEL
ALT: 39 U/L (ref 14–54)
AST: 43 U/L — AB (ref 15–41)
Albumin: 4 g/dL (ref 3.5–5.0)
Alkaline Phosphatase: 79 U/L (ref 38–126)
Anion gap: 12 (ref 5–15)
BUN: 12 mg/dL (ref 6–20)
CHLORIDE: 96 mmol/L — AB (ref 101–111)
CO2: 27 mmol/L (ref 22–32)
CREATININE: 0.6 mg/dL (ref 0.44–1.00)
Calcium: 9.2 mg/dL (ref 8.9–10.3)
GFR calc Af Amer: >60 mL/min (ref >60)
GFR calc non Af Amer: >60 mL/min (ref >60)
GLUCOSE: 186 mg/dL — AB (ref 65–99)
Potassium: 2.5 mmol/L — CL (ref 3.5–5.1)
SODIUM: 135 mmol/L (ref 135–145)
Total Bilirubin: 0.6 mg/dL (ref 0.3–1.2)
Total Protein: 7.8 g/dL (ref 6.5–8.1)

## 2016-11-29 LAB — MAGNESIUM
MAGNESIUM: 1.8 mg/dL (ref 1.7–2.4)
Magnesium: 1.7 mg/dL (ref 1.7–2.4)

## 2016-11-29 LAB — BLOOD GAS, ARTERIAL
Acid-Base Excess: 4.2 mmol/L — ABNORMAL HIGH (ref 0.0–2.0)
BICARBONATE: 28.4 mmol/L — AB (ref 20.0–28.0)
FIO2: 0.32
O2 SAT: 86.5 %
PATIENT TEMPERATURE: 37
pCO2 arterial: 40 mmHg (ref 32.0–48.0)
pH, Arterial: 7.46 — ABNORMAL HIGH (ref 7.350–7.450)
pO2, Arterial: 49 mmHg — ABNORMAL LOW (ref 83.0–108.0)

## 2016-11-29 LAB — LACTIC ACID, PLASMA: Lactic Acid, Venous: 1.4 mmol/L (ref 0.5–1.9)

## 2016-11-29 LAB — GLUCOSE, CAPILLARY: GLUCOSE-CAPILLARY: 226 mg/dL — AB (ref 65–99)

## 2016-11-29 LAB — MRSA PCR SCREENING: MRSA BY PCR: NEGATIVE

## 2016-11-29 LAB — INFLUENZA PANEL BY PCR (TYPE A & B)
Influenza A By PCR: NEGATIVE
Influenza B By PCR: NEGATIVE

## 2016-11-29 LAB — POTASSIUM: Potassium: 3.3 mmol/L — ABNORMAL LOW (ref 3.5–5.1)

## 2016-11-29 LAB — TROPONIN I: Troponin I: <0.03 ng/mL (ref <0.03)

## 2016-11-29 MED ORDER — LEVOFLOXACIN IN D5W 750 MG/150ML IV SOLN: 750.0000 mg | INTRAVENOUS | Status: DC

## 2016-11-29 MED ORDER — DEXTROSE 5 % IV SOLN
500.0000 mg | Freq: Once | INTRAVENOUS | Status: AC
  Administered 2016-11-29: 500 mg via INTRAVENOUS
  Filled 2016-11-29: qty 500

## 2016-11-29 MED ORDER — DOCUSATE SODIUM 100 MG PO CAPS: 100.0000 mg | ORAL_CAPSULE | Freq: Two times a day (BID) | ORAL | Status: DC

## 2016-11-29 MED ORDER — POTASSIUM CHLORIDE 10 MEQ/100ML IV SOLN
10.0000 meq | Freq: Once | INTRAVENOUS | Status: DC
  Filled 2016-11-29: qty 100

## 2016-11-29 MED ORDER — DEXTROSE 5 % IV SOLN
1.0000 g | INTRAVENOUS | Status: DC
  Administered 2016-11-29: 1 g via INTRAVENOUS
  Filled 2016-11-29: qty 10

## 2016-11-29 MED ORDER — ONDANSETRON HCL 4 MG/2ML IJ SOLN: 4.0000 mg | Freq: Four times a day (QID) | INTRAMUSCULAR | Status: DC | PRN

## 2016-11-29 MED ORDER — METHYLPREDNISOLONE SODIUM SUCC 40 MG IJ SOLR
40.0000 mg | Freq: Two times a day (BID) | INTRAMUSCULAR | Status: AC
  Administered 2016-11-29 – 2016-12-01 (×5): 40 mg via INTRAVENOUS
  Filled 2016-11-29 (×5): qty 1

## 2016-11-29 MED ORDER — IRBESARTAN 150 MG PO TABS
75.0000 mg | ORAL_TABLET | Freq: Every day | ORAL | Status: DC
  Administered 2016-11-29 – 2016-12-01 (×3): 75 mg via ORAL
  Filled 2016-11-29 (×3): qty 1

## 2016-11-29 MED ORDER — HYDROCODONE-ACETAMINOPHEN 5-325 MG PO TABS: 1.0000 | ORAL_TABLET | ORAL | Status: DC | PRN

## 2016-11-29 MED ORDER — DIPHENHYDRAMINE HCL 25 MG PO CAPS
25.0000 mg | ORAL_CAPSULE | Freq: Every evening | ORAL | Status: DC | PRN
  Administered 2016-11-29 – 2016-12-02 (×4): 25 mg via ORAL
  Filled 2016-11-29 (×6): qty 1

## 2016-11-29 MED ORDER — SERTRALINE HCL 50 MG PO TABS
50.0000 mg | ORAL_TABLET | Freq: Every day | ORAL | Status: DC
  Administered 2016-11-29 – 2016-12-03 (×5): 50 mg via ORAL
  Filled 2016-11-29 (×5): qty 1

## 2016-11-29 MED ORDER — AZITHROMYCIN 250 MG PO TABS
250.0000 mg | ORAL_TABLET | Freq: Every day | ORAL | Status: AC
  Administered 2016-11-30 – 2016-12-03 (×4): 250 mg via ORAL
  Filled 2016-11-29 (×5): qty 1

## 2016-11-29 MED ORDER — POLYETHYLENE GLYCOL 3350 17 G PO PACK: 17.0000 g | PACK | Freq: Every day | ORAL | Status: DC | PRN

## 2016-11-29 MED ORDER — FLUTICASONE PROPIONATE 50 MCG/ACT NA SUSP
1.0000 | Freq: Every day | NASAL | Status: DC
  Administered 2016-11-29 – 2016-12-02 (×5): 1 via NASAL
  Filled 2016-11-29: qty 16

## 2016-11-29 MED ORDER — METHYLPREDNISOLONE SODIUM SUCC 125 MG IJ SOLR
80.0000 mg | Freq: Four times a day (QID) | INTRAMUSCULAR | Status: DC
  Administered 2016-11-29: 80 mg via INTRAVENOUS
  Filled 2016-11-29: qty 2

## 2016-11-29 MED ORDER — ALBUTEROL SULFATE (2.5 MG/3ML) 0.083% IN NEBU
2.5000 mg | INHALATION_SOLUTION | RESPIRATORY_TRACT | Status: DC | PRN
  Administered 2016-11-29: 2.5 mg via RESPIRATORY_TRACT
  Filled 2016-11-29: qty 3

## 2016-11-29 MED ORDER — PANTOPRAZOLE SODIUM 40 MG PO TBEC
40.0000 mg | DELAYED_RELEASE_TABLET | Freq: Every day | ORAL | Status: DC
  Administered 2016-11-29 – 2016-12-03 (×5): 40 mg via ORAL
  Filled 2016-11-29 (×5): qty 1

## 2016-11-29 MED ORDER — ENOXAPARIN SODIUM 40 MG/0.4ML ~~LOC~~ SOLN
40.0000 mg | SUBCUTANEOUS | Status: DC
  Administered 2016-11-29 – 2016-12-03 (×5): 40 mg via SUBCUTANEOUS
  Filled 2016-11-29 (×5): qty 0.4

## 2016-11-29 MED ORDER — POTASSIUM CHLORIDE IN NACL 40-0.9 MEQ/L-% IV SOLN
INTRAVENOUS | Status: DC
  Administered 2016-11-29: 125 mL/h via INTRAVENOUS
  Filled 2016-11-29 (×3): qty 1000

## 2016-11-29 MED ORDER — DEXTROSE 5 % IV SOLN
500.0000 mg | INTRAVENOUS | Status: DC
  Filled 2016-11-29: qty 500

## 2016-11-29 MED ORDER — IPRATROPIUM-ALBUTEROL 0.5-2.5 (3) MG/3ML IN SOLN
3.0000 mL | Freq: Four times a day (QID) | RESPIRATORY_TRACT | Status: DC
  Administered 2016-11-29 – 2016-12-03 (×17): 3 mL via RESPIRATORY_TRACT
  Filled 2016-11-29 (×16): qty 3

## 2016-11-29 MED ORDER — ONDANSETRON HCL 4 MG PO TABS: 4.0000 mg | ORAL_TABLET | Freq: Four times a day (QID) | ORAL | Status: DC | PRN

## 2016-11-29 MED ORDER — DOCUSATE SODIUM 100 MG PO CAPS
100.0000 mg | ORAL_CAPSULE | Freq: Two times a day (BID) | ORAL | Status: DC
  Administered 2016-11-29 – 2016-12-01 (×5): 100 mg via ORAL
  Filled 2016-11-29 (×7): qty 1

## 2016-11-29 MED ORDER — SIMVASTATIN 20 MG PO TABS
40.0000 mg | ORAL_TABLET | Freq: Every day | ORAL | Status: DC
  Administered 2016-11-29 – 2016-12-02 (×4): 40 mg via ORAL
  Filled 2016-11-29 (×4): qty 2

## 2016-11-29 MED ORDER — ACETAMINOPHEN 325 MG PO TABS
650.0000 mg | ORAL_TABLET | Freq: Four times a day (QID) | ORAL | Status: DC | PRN
Start: 2016-11-29 — End: 2016-12-03
  Administered 2016-12-01: 650 mg via ORAL
  Filled 2016-11-29: qty 2

## 2016-11-29 MED ORDER — GUAIFENESIN ER 600 MG PO TB12
600.0000 mg | ORAL_TABLET | Freq: Two times a day (BID) | ORAL | Status: DC
  Administered 2016-11-29 – 2016-12-03 (×9): 600 mg via ORAL
  Filled 2016-11-29 (×9): qty 1

## 2016-11-29 MED ORDER — BUDESONIDE 0.5 MG/2ML IN SUSP
0.5000 mg | Freq: Two times a day (BID) | RESPIRATORY_TRACT | Status: DC
  Administered 2016-11-29 – 2016-12-03 (×10): 0.5 mg via RESPIRATORY_TRACT
  Filled 2016-11-29 (×12): qty 2

## 2016-11-29 MED ORDER — METHYLPREDNISOLONE SODIUM SUCC 125 MG IJ SOLR
80.0000 mg | Freq: Four times a day (QID) | INTRAMUSCULAR | Status: DC
  Filled 2016-11-29: qty 2

## 2016-11-29 MED ORDER — IPRATROPIUM-ALBUTEROL 0.5-2.5 (3) MG/3ML IN SOLN: 3.0000 mL | RESPIRATORY_TRACT | Status: DC

## 2016-11-29 MED ORDER — CEFTRIAXONE SODIUM IN DEXTROSE 20 MG/ML IV SOLN: 1.0000 g | INTRAVENOUS | Status: DC

## 2016-11-29 MED ORDER — GUAIFENESIN ER 600 MG PO TB12: 600.0000 mg | ORAL_TABLET | Freq: Two times a day (BID) | ORAL | Status: DC

## 2016-11-29 MED ORDER — NICOTINE 14 MG/24HR TD PT24
14.0000 mg | MEDICATED_PATCH | Freq: Every day | TRANSDERMAL | Status: DC
  Administered 2016-11-29 – 2016-12-03 (×5): 14 mg via TRANSDERMAL
  Filled 2016-11-29 (×5): qty 1

## 2016-11-29 MED ORDER — OMEGA-3-ACID ETHYL ESTERS 1 G PO CAPS
1.0000 g | ORAL_CAPSULE | Freq: Every day | ORAL | Status: DC
  Administered 2016-11-29 – 2016-12-03 (×5): 1 g via ORAL
  Filled 2016-11-29 (×5): qty 1

## 2016-11-29 MED ORDER — POTASSIUM CHLORIDE CRYS ER 20 MEQ PO TBCR
40.0000 meq | EXTENDED_RELEASE_TABLET | Freq: Two times a day (BID) | ORAL | Status: AC
  Administered 2016-11-29 – 2016-11-30 (×3): 40 meq via ORAL
  Filled 2016-11-29 (×3): qty 2

## 2016-11-29 MED ORDER — ADULT MULTIVITAMIN W/MINERALS CH
1.0000 | ORAL_TABLET | Freq: Every day | ORAL | Status: DC
  Administered 2016-11-29 – 2016-12-03 (×5): 1 via ORAL
  Filled 2016-11-29 (×5): qty 1

## 2016-11-29 MED ORDER — SODIUM CHLORIDE 0.9% FLUSH
3.0000 mL | Freq: Two times a day (BID) | INTRAVENOUS | Status: DC
  Administered 2016-11-29 – 2016-12-03 (×10): 3 mL via INTRAVENOUS

## 2016-11-29 MED ORDER — POTASSIUM CHLORIDE 10 MEQ/100ML IV SOLN
10.0000 meq | INTRAVENOUS | Status: AC
  Administered 2016-11-29 (×4): 10 meq via INTRAVENOUS
  Filled 2016-11-29 (×4): qty 100

## 2016-11-29 MED ORDER — FLUTICASONE PROPIONATE 50 MCG/ACT NA SUSP: 2.0000 | Freq: Two times a day (BID) | NASAL | Status: DC

## 2016-11-29 MED ORDER — LORATADINE 10 MG PO TABS
10.0000 mg | ORAL_TABLET | Freq: Every day | ORAL | Status: DC
  Administered 2016-11-29 – 2016-12-03 (×5): 10 mg via ORAL
  Filled 2016-11-29 (×6): qty 1

## 2016-11-29 MED ORDER — IPRATROPIUM-ALBUTEROL 0.5-2.5 (3) MG/3ML IN SOLN
3.0000 mL | Freq: Once | RESPIRATORY_TRACT | Status: AC
  Administered 2016-11-29: 3 mL via RESPIRATORY_TRACT
  Filled 2016-11-29: qty 6

## 2016-11-29 MED ORDER — SODIUM CHLORIDE 0.9 % IV BOLUS (SEPSIS)
1000.0000 mL | Freq: Once | INTRAVENOUS | Status: AC
Start: 2016-11-29 — End: 2016-11-29
  Administered 2016-11-29: 1000 mL via INTRAVENOUS

## 2016-11-29 MED ORDER — IPRATROPIUM-ALBUTEROL 0.5-2.5 (3) MG/3ML IN SOLN
RESPIRATORY_TRACT | Status: AC
  Administered 2016-11-29: 3 mL
  Filled 2016-11-29: qty 3

## 2016-11-29 MED ORDER — SODIUM CHLORIDE 0.9 % IV BOLUS (SEPSIS)
1000.0000 mL | Freq: Once | INTRAVENOUS | Status: AC
  Administered 2016-11-29: 1000 mL via INTRAVENOUS

## 2016-11-29 MED ORDER — METHYLPREDNISOLONE SODIUM SUCC 125 MG IJ SOLR
125.0000 mg | Freq: Once | INTRAMUSCULAR | Status: AC
  Administered 2016-11-29: 125 mg via INTRAVENOUS
  Filled 2016-11-29: qty 2

## 2016-11-29 MED ORDER — CEFTRIAXONE SODIUM IN DEXTROSE 20 MG/ML IV SOLN
1.0000 g | Freq: Once | INTRAVENOUS | Status: AC
  Administered 2016-11-29: 1 g via INTRAVENOUS
  Filled 2016-11-29: qty 50

## 2016-11-29 MED ORDER — ACETAMINOPHEN 650 MG RE SUPP: 650.0000 mg | Freq: Four times a day (QID) | RECTAL | Status: DC | PRN

## 2016-11-29 MED ORDER — IPRATROPIUM-ALBUTEROL 0.5-2.5 (3) MG/3ML IN SOLN
3.0000 mL | RESPIRATORY_TRACT | Status: DC
  Administered 2016-11-29: 3 mL via RESPIRATORY_TRACT
  Filled 2016-11-29: qty 3

## 2016-11-29 NOTE — Care Management (Signed)
Met with patient to discuss transition of care and connect with new PCP.  She does not want to go back to [facility] and requests that I assist her with making an appointment with Dr. Lavera Guise at [facility] as this is close to her work.  Dr. Lavera Guise [phone] will she her on 12/16/16 at 10AM. Patient updated and agrees. Patient is currently requiring High Flow O2.

## 2016-11-29 NOTE — ED Notes (Signed)
Date and time results received: 11/29/16 0054 (use smartphrase ".now" to insert current time)  Test: potassium Critical Value: 2.5  Name of Provider Notified: Dr. Dolores FrameSung  Orders Received? Or Actions Taken?: Acknowledged

## 2016-11-29 NOTE — Plan of Care (Signed)
Pt has remained free from injury on my shift. Pt has remained free from pain on my shift. Pt has tolerated weaning from 50 % FiO2 this am to 3L [place]. Pt continues to report feeling SOB and continues to have wheezes audible in all fields. Full assessment in Epic, will continue to assess.

## 2016-11-29 NOTE — ED Notes (Signed)
Admitting md at the bedside.  

## 2016-11-29 NOTE — Progress Notes (Signed)
eLink Physician-Brief Progress Note Patient Name: Carolyn FairlyDebra G Pisarski DOB: Jul 04, 1955 MRN: [id number]   Date of Service  11/29/2016  HPI/Events of Note  COPD exac, did not tolerate bipap, on HFNC CXR _ chronic bibasal int infx Hypokalemia  eICU Interventions  WOB ok on camera monitor     Intervention Category Evaluation Type: New Patient Evaluation  Daylee Delahoz V. Zamiya Dillard 11/29/2016, 4:04 AM

## 2016-11-29 NOTE — Progress Notes (Signed)
Pharmacy Antibiotic Note  Carolyn Owens is a 61 y.o. female admitted on 11/28/2016 with pneumonia.  Pharmacy has been consulted for azithro/ceftriaxone dosing.  Plan: Patient received azithromycin 500 mg IV and ceftriaxone 1g IV x 1 in ED  Will continue azithromycin 500 mg and ceftriaxone 1g IV daily for another 4 days for a total of 5 days treatment. EKG 11/26 QTc 475  Height: 5' (152.4 cm) Weight: 134 lb (60.8 kg) IBW/kg (Calculated) : 45.5  Temp (24hrs), Avg:99.9 F (37.7 C), Min:99.9 F (37.7 C), Max:99.9 F (37.7 C)  Recent Labs  Lab 11/29/16 0003  WBC 14.1*  CREATININE 0.60  LATICACIDVEN 1.4    Estimated Creatinine Clearance: 62.4 mL/min (by C-G formula based on SCr of 0.6 mg/dL).    Allergies  Allergen Reactions  . Adhesive [Tape]   . Bc Powder [Aspirin-Salicylamide-Caffeine]   . Sulfur    Thank you for allowing pharmacy to be a part of this patient's care.  Thomasene Rippleavid Anett Ranker, PharmD, BCPS Clinical Pharmacist 11/29/2016

## 2016-11-29 NOTE — Progress Notes (Signed)
Received patient from ed on oxygen 8 liters via nebulizer tx.  Patient with increased RR. Patient verified she was not able to tolerate bipap in ed. Placed patient on HFNC 55% 40L. Saturation noted at 93%. Tolerating well at this time. SVN given in line. Will continue to monitor.

## 2016-11-29 NOTE — Progress Notes (Signed)
Inpatient Diabetes Program Recommendations  AACE/ADA: New Consensus Statement on Inpatient Glycemic Control (2015)  Target Ranges:  Prepandial:   less than 140 mg/dL      Peak postprandial:   less than 180 mg/dL (1-2 hours)      Critically ill patients:  140 - 180 mg/dL   Results for Carolyn Owens, [name] (MRN [id number]) as of 11/29/2016 12:12  Ref. Range 11/29/2016 03:31  Glucose-Capillary Latest Ref Range: 65 - 99 mg/dL 409226 (H)    Admit with: SOB  NO History of DM noted  Current Insulin Orders: None      MD- Note patient received 125 mg Solumedrol X 1 dose today at Midnight and now getting Solumedrol 40 mg BID  Please consider placing orders for Novolog Sensitive Correction Scale/ SSI (0-9 units) TID AC + HS       --Will follow patient during hospitalization--  Ambrose FinlandJeannine Johnston [name] RN, MSN, CDE Diabetes Coordinator Inpatient Glycemic Control Team Team Pager: [phone] (8a-5p)

## 2016-11-29 NOTE — Plan of Care (Signed)
Patient in from home with c/o SOB.  Admitted to ICU with COPD exacerbation.  Patient unable to tolerate BiPap. Currently on HFNC at 40L, Tolerating but continues with cough and wheezing.  Patient restless, exhausted. Unable to rest.  Fluids w/ potassium infusing at this time.  Makes needs known.  Will continue to monitor.

## 2016-11-29 NOTE — Progress Notes (Signed)
1        [facility] [place] [facility]   PATIENT NAME: Concha SeDebra Lusher    MR#:  [id number]  DATE OF BIRTH:  Nov 29, 1955  SUBJECTIVE:  CHIEF COMPLAINT:   Chief Complaint  Patient presents with  . Shortness of Breath  Off BiPAP, feels somewhat better REVIEW OF SYSTEMS:  Review of Systems  Constitutional: Positive for malaise/fatigue. Negative for chills, fever and weight loss.  HENT: Negative for nosebleeds and sore throat.   Eyes: Negative for blurred vision.  Respiratory: Positive for cough and shortness of breath. Negative for wheezing.   Cardiovascular: Negative for chest pain, orthopnea, leg swelling and PND.  Gastrointestinal: Negative for abdominal pain, constipation, diarrhea, heartburn, nausea and vomiting.  Genitourinary: Negative for dysuria and urgency.  Musculoskeletal: Negative for back pain.  Skin: Negative for rash.  Neurological: Positive for weakness. Negative for dizziness, speech change, focal weakness and headaches.  Endo/Heme/Allergies: Does not bruise/bleed easily.  Psychiatric/Behavioral: Negative for depression.    DRUG ALLERGIES:   Allergies  Allergen Reactions  . Adhesive [Tape]   . Bc Powder [Aspirin-Salicylamide-Caffeine]   . Sulfur    VITALS:  Blood pressure 139/73, pulse 97, temperature 98 F (36.7 C), temperature source Oral, resp. rate (!) 32, height 5\' 1"  (1.549 m), weight 62.8 kg (138 lb 7.2 oz), SpO2 94 %. PHYSICAL EXAMINATION:  Physical Exam  Constitutional: She is oriented to person, place, and time and well-developed, well-nourished, and in no distress.  HENT:  Head: Normocephalic and atraumatic.  Eyes: Conjunctivae and EOM are normal. Pupils are equal, round, and reactive to light.  Neck: Normal range of motion. Neck supple. No tracheal deviation present. No thyromegaly present.  Cardiovascular: Normal rate, regular rhythm and normal heart sounds.  Pulmonary/Chest: Effort normal and breath sounds normal. No  respiratory distress. She has no wheezes. She exhibits no tenderness.  Abdominal: Soft. Bowel sounds are normal. She exhibits no distension. There is no tenderness.  Musculoskeletal: Normal range of motion.  Neurological: She is alert and oriented to person, place, and time. No cranial nerve deficit.  Skin: Skin is warm and dry. No rash noted.  Psychiatric: Mood and affect normal.   LABORATORY PANEL:  Female CBC Recent Labs  Lab 11/29/16 0003  WBC 14.1*  HGB 14.8  HCT 43.5  PLT 214   ------------------------------------------------------------------------------------------------------------------ Chemistries  Recent Labs  Lab 11/29/16 0003  NA 135  K 2.5*  CL 96*  CO2 27  GLUCOSE 186*  BUN 12  CREATININE 0.60  CALCIUM 9.2  MG 1.7  AST 43*  ALT 39  ALKPHOS 79  BILITOT 0.6   RADIOLOGY:  Dg Chest Port 1 View  Result Date: 11/29/2016 CLINICAL DATA:  Shortness of breath EXAM: PORTABLE CHEST 1 VIEW COMPARISON:  11/03/2011 ,10/10/ 2010 FINDINGS: Similar appearance of bibasilar interstitial opacities suggesting chronic change. No acute consolidation or pleural effusion. Stable cardiomediastinal silhouette with atherosclerosis. No pneumothorax. IMPRESSION: No active disease. Stable chronic interstitial opacities at both lung bases. Electronically Signed   By: Jasmine PangKim  Fujinaga M.D.   On: 11/29/2016 00:41   ASSESSMENT AND PLAN:   1 acute newly diagnosed COPD exacerbation - continue IV Solu-Medrol with taper as tolerated - Zithromax for bronchitis, Pneumonia ruled out  2 acute hypokalemia Replete and Recheck  3 acute hyponatremia Resolved with hydration  4 chronic benign essential hypertension - continue Avapro, monitor  5 chronic hyperlipidemia, unspecified - continue zocor    Ok to transfer to floor if ok by [employer]PCCM  All the records are reviewed and case discussed with Care Management/Social Worker. Management plans discussed with the patient, nursing and they  are in agreement.  CODE STATUS: Full Code  TOTAL TIME TAKING CARE OF THIS PATIENT: 25 minutes.   More than 50% of the time was spent in counseling/coordination of care: YES  POSSIBLE D/C IN 1-2 DAYS, DEPENDING ON CLINICAL CONDITION.   Delfino LovettVipul Jana Swartzlander M.D on 11/29/2016 at 4:51 PM  Between 7am to 6pm - Pager - [phone]  After 6pm go to [email or web address] - Social research officer, governmentpassword [email or web address]  [facility]  Office  [phone]  CC: Primary care physician; Tommie Samsook, Jayce G, DO  Note: This dictation was prepared with Dragon dictation along with smaller phrase technology. Any transcriptional errors that result from this process are unintentional.

## 2016-11-29 NOTE — Consult Note (Signed)
PULMONARY / CRITICAL CARE MEDICINE   Name: Carolyn FairlyDebra G Derhammer MRN: [id number] DOB: 07/16/1955    ADMISSION DATE:  11/28/2016 PT PROFILE:  89 F smoker with no prior history of COPD dx admitted with respiratory distress and bronchospasm.   HISTORY OF PRESENT ILLNESS:   As above. Now much improved since admission and treatment for COPD ex but remains on HFNC. Denies CP, fever, hemoptysis, lower extremity edema, calf tenderness.  PAST MEDICAL HISTORY :  She  has a past medical history of Anxiety, Chicken pox, Glaucoma, History of blood transfusion, Hyperlipidemia, Hypertension, and Multiple gastric ulcers.  PAST SURGICAL HISTORY: She  has a past surgical history that includes Vaginal hysterectomy (2004); ankle bone infection (Right, 1965); Hernia repair (2005); Wrist ganglion excision (Right, 1983); and Partial hysterectomy (2004/2005).  Allergies  Allergen Reactions  . Adhesive [Tape]   . Bc Powder [Aspirin-Salicylamide-Caffeine]   . Sulfur     No current facility-administered medications on file prior to encounter.    Current Outpatient Medications on File Prior to Encounter  Medication Sig  . cetirizine (ZYRTEC) 10 MG tablet Take 10 mg by mouth daily.  . Cholecalciferol (D3 ADULT PO) Take 1,000 Units by mouth.  . fluticasone (FLONASE) 50 MCG/ACT nasal spray SPRAY 2 SPRAYS IN EACH NOSTRIL EVERY DAY  . Multiple Vitamin (MULTIVITAMIN) tablet Take 1 tablet by mouth daily.  . Omega-3 Fatty Acids (FISH OIL PO) Take 1,400 mg by mouth.  Marland Kitchen. omeprazole (PRILOSEC) 20 MG capsule Take 20 mg by mouth 2 (two) times daily.  Marland Kitchen. PROAIR HFA 108 (90 Base) MCG/ACT inhaler Place 2 puffs into the nose as needed.  . simvastatin (ZOCOR) 40 MG tablet TAKE 1 TABLET (40 MG) BY ORAL ROUTE ONCE DAILY IN THE EVENING  . valsartan-hydrochlorothiazide (DIOVAN-HCT) 160-12.5 MG tablet Take 1 tablet by mouth daily.  . sertraline (ZOLOFT) 50 MG tablet Take 50 mg by mouth daily.    FAMILY HISTORY:  Her indicated that the  status of her mother is unknown. She indicated that the status of her father is unknown. She indicated that the status of her paternal grandfather is unknown. She indicated that the status of her maternal aunt is unknown. She indicated that the status of her maternal uncle is unknown. She indicated that the status of her paternal aunt is unknown.   SOCIAL HISTORY: She  reports that she has been smoking.  she has never used smokeless tobacco. She reports that she drinks about 0.6 - 1.2 oz of alcohol per week.  REVIEW OF SYSTEMS:   As per HPI  SUBJECTIVE:    VITAL SIGNS: BP 139/73 (BP Location: Left Arm)   Pulse 97   Temp 98 F (36.7 C) (Oral)   Resp (!) 32   Ht 5\' 1"  (1.549 m)   Wt 62.8 kg (138 lb 7.2 oz)   SpO2 94%   BMI 26.16 kg/m   HEMODYNAMICS:    VENTILATOR SETTINGS: FiO2 (%):  [40 %-55 %] 40 %  INTAKE / OUTPUT: I/O last 3 completed shifts: In: 1350 [IV Piggyback:1350] Out: -   PHYSICAL EXAMINATION: General: NAD Neuro: No focal deficits HEENT: NCAT, sclerae white Cardiovascular: Regular, no M Lungs: Diminished breath sounds throughout, few scattered wheezes, prolonged expiratory phase Abdomen: Soft, NABS Extremities: Warm, no edema Skin: No lesions noted  LABS:  BMET Recent Labs  Lab 11/29/16 0003  NA 135  K 2.5*  CL 96*  CO2 27  BUN 12  CREATININE 0.60  GLUCOSE 186*    Electrolytes Recent  Labs  Lab 11/29/16 0003  CALCIUM 9.2  MG 1.7    CBC Recent Labs  Lab 11/29/16 0003  WBC 14.1*  HGB 14.8  HCT 43.5  PLT 214    Coag's No results for input(s): APTT, INR in the last 168 hours.  Sepsis Markers Recent Labs  Lab 11/29/16 0003  LATICACIDVEN 1.4    ABG Recent Labs  Lab 11/29/16 0005  PHART 7.46*  PCO2ART 40  PO2ART 49*    Liver Enzymes Recent Labs  Lab 11/29/16 0003  AST 43*  ALT 39  ALKPHOS 79  BILITOT 0.6  ALBUMIN 4.0    Cardiac Enzymes Recent Labs  Lab 11/29/16 0003  TROPONINI <0.03    Glucose Recent  Labs  Lab 11/29/16 0331  GLUCAP 226*    Imaging Dg Chest Port 1 View  Result Date: 11/29/2016 CLINICAL DATA:  Shortness of breath EXAM: PORTABLE CHEST 1 VIEW COMPARISON:  11/03/2011 ,10/10/ 2010 FINDINGS: Similar appearance of bibasilar interstitial opacities suggesting chronic change. No acute consolidation or pleural effusion. Stable cardiomediastinal silhouette with atherosclerosis. No pneumothorax. IMPRESSION: No active disease. Stable chronic interstitial opacities at both lung bases. Electronically Signed   By: Jasmine PangKim  Fujinaga M.D.   On: 11/29/2016 00:41     ASSESSMENT / PLAN: Acute hypoxemic respiratory failure Smoker COPD exacerbation  Continue systemic steroids -dose adjusted Continue supplemental oxygen -wean as able maintaining SPO2 >90% Continue nebulized steroids and bronchodilators Complete 5 days a azithromycin Discontinue ceftriaxone (doubt pneumonia) Counseled regarding smoking cessation  Billy Fischeravid Simonds, MD PCCM service Mobile [phone] Pager [phone] 11/29/2016 4:15 PM   11/29/2016, 4:10 PM

## 2016-11-29 NOTE — H&P (Signed)
[facility] [place] NAME: Carolyn Owens    MR#:  [id number]  DATE OF BIRTH:  1955/08/22  DATE OF ADMISSION:  11/28/2016  PRIMARY CARE PHYSICIAN: Coral Spikes, DO   REQUESTING/REFERRING PHYSICIAN:   CHIEF COMPLAINT:   Chief Complaint  Patient presents with  . Shortness of Breath    HISTORY OF PRESENT ILLNESS: Carolyn Owens  is a 61 y.o. female with a known history per below, chronic tobacco smoking abuse/dependency presenting to the emergency room with 2-day history of worsening shortness of breath, dyspnea on exertion, fevers, productive cough, chest tightness, in the emergency room patient was found to be hypoxic with O2 saturation in the 80s, tachycardic with heart rate 126, tachypnea with respiratory rate of 22, potassium 2.5, chloride 96, white count 14,000, chest x-ray noted for chronic interstitial opacities at the lung bases, patient with air hunger/increased work of breathing on evaluation, did require BiPAP in the emergency room, the patient is now been admitted for newly diagnosed acute COPD exacerbation.  PAST MEDICAL HISTORY:   Past Medical History:  Diagnosis Date  . Anxiety   . Chicken pox   . Glaucoma    suspect patient  . History of blood transfusion   . Hyperlipidemia   . Hypertension   . Multiple gastric ulcers     PAST SURGICAL HISTORY:  Past Surgical History:  Procedure Laterality Date  . ankle bone infection Right 1965  . HERNIA REPAIR  2005  . PARTIAL HYSTERECTOMY  2004/2005  . VAGINAL HYSTERECTOMY  2004   Has ovaries (no oophorectomy).  . WRIST GANGLION EXCISION Right 1983    SOCIAL HISTORY:  Social History   Tobacco Use  . Smoking status: Current Every Day Smoker  . Smokeless tobacco: Never Used  Substance Use Topics  . Alcohol use: Yes    Alcohol/week: 0.6 - 1.2 oz    Types: 1 - 2 Standard drinks or equivalent per week    FAMILY HISTORY:  Family History  Problem Relation Age of Onset  . Lung  cancer Father   . Hyperlipidemia Father   . Hypertension Father   . Hyperlipidemia Mother   . Hypertension Mother   . Pancreatic cancer Mother   . Diabetes Mother   . Multiple myeloma Paternal Grandfather   . Liver cancer Maternal Aunt   . Breast cancer Paternal Aunt   . Brain cancer Maternal Uncle     DRUG ALLERGIES:  Allergies  Allergen Reactions  . Adhesive [Tape]   . Bc Powder [Aspirin-Salicylamide-Caffeine]   . Sulfur     REVIEW OF SYSTEMS: Poor historian due to air hunger/increased work of breathing  CONSTITUTIONAL: No fever, +fatigue/weakness.  EYES: No blurred or double vision.  EARS, NOSE, AND THROAT: No tinnitus or ear pain.  RESPIRATORY: + cough/shortness of breath/wheezing, no hemoptysis.  CARDIOVASCULAR: No chest pain, orthopnea, edema.  GASTROINTESTINAL: No nausea, vomiting, diarrhea or abdominal pain.  GENITOURINARY: No dysuria, hematuria.  ENDOCRINE: No polyuria, nocturia,  HEMATOLOGY: No anemia, easy bruising or bleeding SKIN: No rash or lesion. MUSCULOSKELETAL: No joint pain or arthritis.   NEUROLOGIC: No tingling, numbness, weakness.  PSYCHIATRY: No anxiety or depression.   MEDICATIONS AT HOME:  Prior to Admission medications   Medication Sig Start Date End Date Taking? Authorizing Provider  cetirizine (ZYRTEC) 10 MG tablet Take 10 mg by mouth daily. 02/20/15  Yes [provider]  Cholecalciferol (D3 ADULT PO) Take 1,000 Units by mouth.   Yes [provider]  fluticasone (FLONASE) 50 MCG/ACT nasal spray SPRAY 2 SPRAYS IN EACH NOSTRIL EVERY DAY 02/20/15  Yes [provider]  Multiple Vitamin (MULTIVITAMIN) tablet Take 1 tablet by mouth daily.   Yes [provider]  Omega-3 Fatty Acids (FISH OIL PO) Take 1,400 mg by mouth.   Yes [provider]  omeprazole (PRILOSEC) 20 MG capsule Take 20 mg by mouth 2 (two) times daily. 02/20/15  Yes [provider]  PROAIR HFA 108 (90 Base) MCG/ACT inhaler Place 2  puffs into the nose as needed. 02/20/15  Yes [provider]  simvastatin (ZOCOR) 40 MG tablet TAKE 1 TABLET (40 MG) BY ORAL ROUTE ONCE DAILY IN THE EVENING 12/25/14  Yes [provider]  valsartan-hydrochlorothiazide (DIOVAN-HCT) 160-12.5 MG tablet Take 1 tablet by mouth daily. 02/20/15  Yes [provider]  sertraline (ZOLOFT) 50 MG tablet Take 50 mg by mouth daily. 02/17/15   [provider]      PHYSICAL EXAMINATION:   VITAL SIGNS: Blood pressure 116/64, pulse (!) 108, temperature 99.9 F (37.7 C), temperature source Axillary, resp. rate (!) 26, height 5' (1.524 m), weight 60.8 kg (134 lb), SpO2 98 %.  GENERAL:  61 y.o.-year-old patient lying in the bed with moderate respiratory acute distress.  Nontoxic appearing EYES: Pupils equal, round, reactive to light and accommodation. No scleral icterus. Extraocular muscles intact.  HEENT: Head atraumatic, normocephalic. Oropharynx and nasopharynx clear.  NECK:  Supple, no jugular venous distention. No thyroid enlargement, no tenderness.  LUNGS: Severely diminished breath sounds throughout with bilateral wheezing/rhonchi.  Increased work of breathing with use of accessory muscles of respiration.  CARDIOVASCULAR: S1, S2 normal. No murmurs, rubs, or gallops.  ABDOMEN: Soft, nontender, nondistended. Bowel sounds present. No organomegaly or mass.  EXTREMITIES: No pedal edema, cyanosis, or clubbing.  NEUROLOGIC: Cranial nerves II through XII are intact. MAES. Gait not checked.  PSYCHIATRIC: The patient is alert and oriented x 3.  SKIN: No obvious rash, lesion, or ulcer.   LABORATORY PANEL:   CBC Recent Labs  Lab 11/29/16 0003  WBC 14.1*  HGB 14.8  HCT 43.5  PLT 214  MCV 90.1  MCH 30.7  MCHC 34.1  RDW 12.6  LYMPHSABS 1.6  MONOABS 1.1*  EOSABS 0.0  BASOSABS 0.0   ------------------------------------------------------------------------------------------------------------------  Chemistries  Recent  Labs  Lab 11/29/16 0003  NA 135  K 2.5*  CL 96*  CO2 27  GLUCOSE 186*  BUN 12  CREATININE 0.60  CALCIUM 9.2  AST 43*  ALT 39  ALKPHOS 79  BILITOT 0.6   ------------------------------------------------------------------------------------------------------------------ estimated creatinine clearance is 62.4 mL/min (by C-G formula based on SCr of 0.6 mg/dL). ------------------------------------------------------------------------------------------------------------------ No results for input(s): TSH, T4TOTAL, T3FREE, THYROIDAB in the last 72 hours.  Invalid input(s): FREET3   Coagulation profile No results for input(s): INR, PROTIME in the last 168 hours. ------------------------------------------------------------------------------------------------------------------- No results for input(s): DDIMER in the last 72 hours. -------------------------------------------------------------------------------------------------------------------  Cardiac Enzymes Recent Labs  Lab 11/29/16 0003  TROPONINI <0.03   ------------------------------------------------------------------------------------------------------------------ Invalid input(s): POCBNP  ---------------------------------------------------------------------------------------------------------------  Urinalysis    Component Value Date/Time   COLORURINE YELLOW (A) 11/29/2016 0019   APPEARANCEUR HAZY (A) 11/29/2016 0019   LABSPEC 1.024 11/29/2016 0019   PHURINE 5.0 11/29/2016 0019   GLUCOSEU NEGATIVE 11/29/2016 0019   HGBUR SMALL (A) 11/29/2016 0019   BILIRUBINUR NEGATIVE 11/29/2016 0019   KETONESUR NEGATIVE 11/29/2016 0019   PROTEINUR 100 (A) 11/29/2016 0019   NITRITE NEGATIVE 11/29/2016 0019   LEUKOCYTESUR NEGATIVE 11/29/2016 0019  RADIOLOGY: Dg Chest Port 1 View  Result Date: 11/29/2016 CLINICAL DATA:  Shortness of breath EXAM: PORTABLE CHEST 1 VIEW COMPARISON:  11/03/2011 ,10/10/ 2010 FINDINGS: Similar  appearance of bibasilar interstitial opacities suggesting chronic change. No acute consolidation or pleural effusion. Stable cardiomediastinal silhouette with atherosclerosis. No pneumothorax. IMPRESSION: No active disease. Stable chronic interstitial opacities at both lung bases. Electronically Signed   By: Donavan Foil M.D.   On: 11/29/2016 00:41    EKG: Orders placed or performed during the hospital encounter of 11/28/16  . ED EKG  . ED EKG  . EKG 12-Lead  . EKG 12-Lead    IMPRESSION AND PLAN: 1 acute newly diagnosed COPD exacerbation Admit to regular nursing floor bed, IV Solu-Medrol with taper as tolerated, empiric Levaquin 5-7-day course, aggressive pulmonary toilet with bronchodilator therapy, respiratory therapy to evaluate/treat, supplemental oxygen as needed, wean BiPAP off as tolerated, and follow-up on cultures  2 acute hypokalemia Replete with p.o. potassium/IV fluids, check potassium/magnesium at 5 AM  3 acute hyponatremia Replete with IV fluids and check BMP in the morning  4 chronic benign essential hypertension Stable Hold hydrochlorothiazide, continue other blood pressure agents  5 chronic hyperlipidemia, unspecified Stable Continue statin therapy  Full code Condition stable Prognosis fair DVT prophylaxis with Lovenox subcu Disposition home in 2-3 days   All the records are reviewed and case discussed with ED provider. Management plans discussed with the patient, family and they are in agreement.  CODE STATUS: Code Status History    This patient does not have a recorded code status. Please follow your organizational policy for patients in this situation.       TOTAL TIME TAKING CARE OF THIS PATIENT: 40 minutes.    Avel Peace Hagop Mccollam M.D on 11/29/2016   Between 7am to 6pm - Pager - [phone]  After 6pm go to [email or web address] - password [email or web address] [facility]  Office  [phone]  CC: Primary care physician; Coral Spikes,  DO   Note: This dictation was prepared with Dragon dictation along with smaller phrase technology. Any transcriptional errors that result from this process are unintentional.

## 2016-11-30 DIAGNOSIS — R480 Dyslexia and alexia: Secondary | ICD-10-CM

## 2016-11-30 LAB — BASIC METABOLIC PANEL
ANION GAP: 8 (ref 5–15)
BUN: 13 mg/dL (ref 6–20)
CHLORIDE: 105 mmol/L (ref 101–111)
CO2: 27 mmol/L (ref 22–32)
Calcium: 8.9 mg/dL (ref 8.9–10.3)
Creatinine, Ser: 0.43 mg/dL — ABNORMAL LOW (ref 0.44–1.00)
GFR calc non Af Amer: >60 mL/min (ref >60)
Glucose, Bld: 154 mg/dL — ABNORMAL HIGH (ref 65–99)
Potassium: 3.5 mmol/L (ref 3.5–5.1)
Sodium: 140 mmol/L (ref 135–145)

## 2016-11-30 LAB — CBC WITH DIFFERENTIAL/PLATELET
BASOS ABS: 0 10*3/uL (ref 0–0.1)
BASOS PCT: 0 %
Eosinophils Absolute: 0 10*3/uL (ref 0–0.7)
Eosinophils Relative: 0 %
HEMATOCRIT: 39.3 % (ref 35.0–47.0)
Hemoglobin: 13.4 g/dL (ref 12.0–16.0)
Lymphocytes Relative: 11 %
Lymphs Abs: 1.6 10*3/uL (ref 1.0–3.6)
MCH: 31 pg (ref 26.0–34.0)
MCHC: 34 g/dL (ref 32.0–36.0)
MCV: 91.2 fL (ref 80.0–100.0)
MONOS PCT: 7 %
Monocytes Absolute: 1.1 10*3/uL — ABNORMAL HIGH (ref 0.2–0.9)
NEUTROS ABS: 12 10*3/uL — AB (ref 1.4–6.5)
Neutrophils Relative %: 82 %
Platelets: 222 10*3/uL (ref 150–440)
RBC: 4.3 MIL/uL (ref 3.80–5.20)
RDW: 12.9 % (ref 11.5–14.5)
WBC: 14.7 10*3/uL — ABNORMAL HIGH (ref 3.6–11.0)

## 2016-11-30 LAB — URINE CULTURE

## 2016-11-30 LAB — HIV ANTIBODY (ROUTINE TESTING W REFLEX): HIV SCREEN 4TH GENERATION: NONREACTIVE

## 2016-11-30 NOTE — Progress Notes (Signed)
ADMISSION DATE:  11/28/2016 PT PROFILE:  [age] F smoker with no prior history of COPD dx admitted with respiratory distress and bronchospasm.  SUBJ: No new complaints.  No distress at rest.  This morning, was still on HF [place] at 35%.  OBJ: Vitals:   11/30/16 0700 11/30/16 0746 11/30/16 0900 11/30/16 1000  BP: 136/67 139/66 (!) 146/78 127/82  Pulse: 82  97 93  Resp: (!) 26  (!) 24 (!) 28  Temp:  98.4 F (36.9 C)    TempSrc:  Oral    SpO2: 97%  (!) 89% 93%  Weight:      Height:        NAD HEENT WNL No JVD Diffuse, scattered coarse expiratory wheezes RRR, no M NABS, soft Extremities without edema No focal neuro deficits  BMP Latest Ref Rng & Units 11/30/2016 11/29/2016 11/29/2016  Glucose 65 - 99 mg/dL 454(U154(H) - 981(X186(H)  BUN 6 - 20 mg/dL 13 - 12  Creatinine 9.140.44 - 1.00 mg/dL 7.82(N0.43(L) - 5.620.60  Sodium 135 - 145 mmol/L 140 - 135  Potassium 3.5 - 5.1 mmol/L 3.5 3.3(L) 2.5(LL)  Chloride 101 - 111 mmol/L 105 - 96(L)  CO2 22 - 32 mmol/L 27 - 27  Calcium 8.9 - 10.3 mg/dL 8.9 - 9.2    CBC Latest Ref Rng & Units 11/30/2016 11/29/2016 04/23/2014  WBC 3.6 - 11.0 K/uL 14.7(H) 14.1(H) -  Hemoglobin 12.0 - 16.0 g/dL 13.013.4 86.514.8 -  Hematocrit 35.0 - 47.0 % 39.3 43.5 41.1  Platelets 150 - 440 K/uL 222 214 -    No new chest x-ray  IMPRESSION: Acute hypoxemic respiratory failure COPD exacerbation with continued wheezing Smoker  PLAN/REC: Will leave in SDU today Continue systemic steroids  at current dose Continue supplemental oxygen -wean as able maintaining SPO2 >90% Continue nebulized steroids and bronchodilators Complete 5 days a azithromycin Counseled again regarding smoking cessation  Billy Fischeravid Simonds, MD PCCM service Mobile [phone] Pager [phone] 11/30/2016 1:51 PM

## 2016-11-30 NOTE — Progress Notes (Signed)
1        [facility] [place] [facility]   PATIENT NAME: Carolyn Owens    MR#:  [id number]  DATE OF BIRTH:  [birth year]  SUBJECTIVE:  CHIEF COMPLAINT:   Chief Complaint  Patient presents with  . Shortness of Breath  slowly improving, requiring high flow nasal cannula when I saw her REVIEW OF SYSTEMS:  Review of Systems  Constitutional: Positive for malaise/fatigue. Negative for chills, fever and weight loss.  HENT: Negative for nosebleeds and sore throat.   Eyes: Negative for blurred vision.  Respiratory: Positive for cough and shortness of breath. Negative for wheezing.   Cardiovascular: Negative for chest pain, orthopnea, leg swelling and PND.  Gastrointestinal: Negative for abdominal pain, constipation, diarrhea, heartburn, nausea and vomiting.  Genitourinary: Negative for dysuria and urgency.  Musculoskeletal: Negative for back pain.  Skin: Negative for rash.  Neurological: Positive for weakness. Negative for dizziness, speech change, focal weakness and headaches.  Endo/Heme/Allergies: Does not bruise/bleed easily.  Psychiatric/Behavioral: Negative for depression.    DRUG ALLERGIES:   Allergies  Allergen Reactions  . Adhesive [Tape]   . Bc Powder [Aspirin-Salicylamide-Caffeine]   . Sulfur    VITALS:  Blood pressure (!) 156/84, pulse 95, temperature 98.6 F (37 C), temperature source Oral, resp. rate (!) 26, height 5\' 1"  (1.549 m), weight 62.8 kg (138 lb 7.2 oz), SpO2 91 %. PHYSICAL EXAMINATION:  Physical Exam  Constitutional: She is oriented to person, place, and time and well-developed, well-nourished, and in no distress.  HENT:  Head: Normocephalic and atraumatic.  Eyes: Conjunctivae and EOM are normal. Pupils are equal, round, and reactive to light.  Neck: Normal range of motion. Neck supple. No tracheal deviation present. No thyromegaly present.  Cardiovascular: Normal rate, regular rhythm and normal heart sounds.  Pulmonary/Chest: Effort  normal and breath sounds normal. No respiratory distress. She has no wheezes. She exhibits no tenderness.  Abdominal: Soft. Bowel sounds are normal. She exhibits no distension. There is no tenderness.  Musculoskeletal: Normal range of motion.  Neurological: She is alert and oriented to person, place, and time. No cranial nerve deficit.  Skin: Skin is warm and dry. No rash noted.  Psychiatric: Mood and affect normal.   LABORATORY PANEL:  Female CBC Recent Labs  Lab 11/30/16 0603  WBC 14.7*  HGB 13.4  HCT 39.3  PLT 222   ------------------------------------------------------------------------------------------------------------------ Chemistries  Recent Labs  Lab 11/29/16 0003 11/29/16 2011 11/30/16 0603  NA 135  --  140  K 2.5* 3.3* 3.5  CL 96*  --  105  CO2 27  --  27  GLUCOSE 186*  --  154*  BUN 12  --  13  CREATININE 0.60  --  0.43*  CALCIUM 9.2  --  8.9  MG 1.7 1.8  --   AST 43*  --   --   ALT 39  --   --   ALKPHOS 79  --   --   BILITOT 0.6  --   --    RADIOLOGY:  No results found. ASSESSMENT AND PLAN:   1 acute COPD exacerbation -Solu-Medrol 40 mg IV twice daily - Zithromax for bronchitis, Pneumonia ruled out  2 acute hypokalemia Replete and Recheck  3 acute hyponatremia Resolved with hydration  4 chronic benign essential hypertension - continue Avapro, monitor  5 chronic hyperlipidemia, unspecified - continue zocor    Ok to transfer to floor if ok by PCCM   All the records are reviewed and  case discussed with Care Management/Social Worker. Management plans discussed with the patient, nursing and they are in agreement.  CODE STATUS: Full Code  TOTAL TIME TAKING CARE OF THIS PATIENT: 15 minutes.   More than 50% of the time was spent in counseling/coordination of care: YES  POSSIBLE D/C IN 1-2 DAYS, DEPENDING ON CLINICAL CONDITION.   Delfino LovettVipul Avi Archuleta M.D on 11/30/2016 at 5:03 PM  Between 7am to 6pm - Pager - [phone]  After 6pm go to  [email or web address] - Social research officer, governmentpassword [email or web address]  [facility]  Office  [phone]  CC: Primary care physician; Tommie Samsook, Jayce G, DO  Note: This dictation was prepared with Dragon dictation along with smaller phrase technology. Any transcriptional errors that result from this process are unintentional.

## 2016-12-01 ENCOUNTER — Other Ambulatory Visit: Payer: Self-pay | Admitting: *Deleted

## 2016-12-01 DIAGNOSIS — J189 Pneumonia, unspecified organism: Secondary | ICD-10-CM

## 2016-12-01 MED ORDER — POTASSIUM CHLORIDE CRYS ER 20 MEQ PO TBCR
40.0000 meq | EXTENDED_RELEASE_TABLET | Freq: Two times a day (BID) | ORAL | Status: AC
  Administered 2016-12-01 (×2): 40 meq via ORAL
  Filled 2016-12-01 (×2): qty 2

## 2016-12-01 MED ORDER — LABETALOL HCL 5 MG/ML IV SOLN
10.0000 mg | INTRAVENOUS | Status: DC | PRN
  Administered 2016-12-01: 10 mg via INTRAVENOUS
  Filled 2016-12-01: qty 4

## 2016-12-01 MED ORDER — PREDNISONE 20 MG PO TABS
40.0000 mg | ORAL_TABLET | Freq: Every day | ORAL | Status: DC
  Administered 2016-12-02 – 2016-12-03 (×2): 40 mg via ORAL
  Filled 2016-12-01 (×2): qty 2

## 2016-12-01 MED ORDER — IRBESARTAN 150 MG PO TABS
150.0000 mg | ORAL_TABLET | Freq: Every day | ORAL | Status: DC
  Administered 2016-12-02 – 2016-12-03 (×2): 150 mg via ORAL
  Filled 2016-12-01 (×2): qty 1

## 2016-12-01 NOTE — Progress Notes (Signed)
1        [facility] [place] [facility]   PATIENT NAME: Carolyn Owens    MR#:  [id number]  DATE OF BIRTH:  [birth year]  SUBJECTIVE:  CHIEF COMPLAINT:   Chief Complaint  Patient presents with  . Shortness of Breath  slowly improving, no new issues REVIEW OF SYSTEMS:  Review of Systems  Constitutional: Positive for malaise/fatigue. Negative for chills, fever and weight loss.  HENT: Negative for nosebleeds and sore throat.   Eyes: Negative for blurred vision.  Respiratory: Positive for cough and shortness of breath. Negative for wheezing.   Cardiovascular: Negative for chest pain, orthopnea, leg swelling and PND.  Gastrointestinal: Negative for abdominal pain, constipation, diarrhea, heartburn, nausea and vomiting.  Genitourinary: Negative for dysuria and urgency.  Musculoskeletal: Negative for back pain.  Skin: Negative for rash.  Neurological: Positive for weakness. Negative for dizziness, speech change, focal weakness and headaches.  Endo/Heme/Allergies: Does not bruise/bleed easily.  Psychiatric/Behavioral: Negative for depression.    DRUG ALLERGIES:   Allergies  Allergen Reactions  . Adhesive [Tape]   . Bc Powder [Aspirin-Salicylamide-Caffeine]   . Sulfur    VITALS:  Blood pressure (!) 156/79, pulse (!) 101, temperature 98.3 F (36.8 C), temperature source Oral, resp. rate (!) 29, height 5\' 1"  (1.549 m), weight 62.8 kg (138 lb 7.2 oz), SpO2 90 %. PHYSICAL EXAMINATION:  Physical Exam  Constitutional: She is oriented to person, place, and time and well-developed, well-nourished, and in no distress.  HENT:  Head: Normocephalic and atraumatic.  Eyes: Conjunctivae and EOM are normal. Pupils are equal, round, and reactive to light.  Neck: Normal range of motion. Neck supple. No tracheal deviation present. No thyromegaly present.  Cardiovascular: Normal rate, regular rhythm and normal heart sounds.  Pulmonary/Chest: Effort normal and breath sounds  normal. No respiratory distress. She has no wheezes. She exhibits no tenderness.  Abdominal: Soft. Bowel sounds are normal. She exhibits no distension. There is no tenderness.  Musculoskeletal: Normal range of motion.  Neurological: She is alert and oriented to person, place, and time. No cranial nerve deficit.  Skin: Skin is warm and dry. No rash noted.  Psychiatric: Mood and affect normal.   LABORATORY PANEL:  Female CBC Recent Labs  Lab 11/30/16 0603  WBC 14.7*  HGB 13.4  HCT 39.3  PLT 222   ------------------------------------------------------------------------------------------------------------------ Chemistries  Recent Labs  Lab 11/29/16 0003 11/29/16 2011 11/30/16 0603  NA 135  --  140  K 2.5* 3.3* 3.5  CL 96*  --  105  CO2 27  --  27  GLUCOSE 186*  --  154*  BUN 12  --  13  CREATININE 0.60  --  0.43*  CALCIUM 9.2  --  8.9  MG 1.7 1.8  --   AST 43*  --   --   ALT 39  --   --   ALKPHOS 79  --   --   BILITOT 0.6  --   --    RADIOLOGY:  No results found. ASSESSMENT AND PLAN:   1 acute COPD exacerbation -Solu-Medrol today and prednisone starting tomorrow - Zithromax for bronchitis, Pneumonia ruled out - will need steroid taper, and Anoro or Stiolto inhaler as maintenance bronchodilator therapy. Also, will need albuterol rescue inhaler at D/C  2 acute hypokalemia Repleted and Resolved  3 acute hyponatremia Resolved with hydration  4 chronic benign essential hypertension - continue Avapro, monitor  5 chronic hyperlipidemia, unspecified - continue zocor  Ok to transfer to floor if ok by PCCM   All the records are reviewed and case discussed with Care Management/Social Worker. Management plans discussed with the patient, nursing and they are in agreement.  CODE STATUS: Full Code  TOTAL TIME TAKING CARE OF THIS PATIENT: 15 minutes.   More than 50% of the time was spent in counseling/coordination of care: YES  POSSIBLE D/C IN 1-2 DAYS,  DEPENDING ON CLINICAL CONDITION.   Delfino LovettVipul Signe Tackitt M.D on 12/01/2016 at 5:22 PM  Between 7am to 6pm - Pager - [phone]  After 6pm go to [email or web address] - Social research officer, governmentpassword [email or web address]  [facility]  Office  [phone]  CC: Primary care physician; Tommie Samsook, Jayce G, DO  Note: This dictation was prepared with Dragon dictation along with smaller phrase technology. Any transcriptional errors that result from this process are unintentional.

## 2016-12-01 NOTE — Progress Notes (Signed)
ADMISSION DATE:  11/28/2016 PT PROFILE:  [age] F smoker with no prior history of COPD dx admitted with respiratory distress and bronchospasm.  SUBJ: No new complaints.  No distress at rest.  This morning, was still on HF [place] at 35%.  OBJ: Vitals:   12/01/16 1000 12/01/16 1100 12/01/16 1200 12/01/16 1300  BP: (!) 154/89 (!) 160/79 (!) 153/116   Pulse: 97 91 95 98  Resp: (!) 26 (!) 22 (!) 23 (!) 23  Temp:      TempSrc:      SpO2: 91% 92% (!) 88% 92%  Weight:      Height:        NAD HEENT WNL No JVD NSC diffuse, scattered coarse expiratory wheezes RRR, no M NABS, soft Extremities without edema No focal neuro deficits  BMP Latest Ref Rng & Units 11/30/2016 11/29/2016 11/29/2016  Glucose 65 - 99 mg/dL 161(W154(H) - 960(A186(H)  BUN 6 - 20 mg/dL 13 - 12  Creatinine 5.400.44 - 1.00 mg/dL 9.81(X0.43(L) - 9.140.60  Sodium 135 - 145 mmol/L 140 - 135  Potassium 3.5 - 5.1 mmol/L 3.5 3.3(L) 2.5(LL)  Chloride 101 - 111 mmol/L 105 - 96(L)  CO2 22 - 32 mmol/L 27 - 27  Calcium 8.9 - 10.3 mg/dL 8.9 - 9.2    CBC Latest Ref Rng & Units 11/30/2016 11/29/2016 04/23/2014  WBC 3.6 - 11.0 K/uL 14.7(H) 14.1(H) -  Hemoglobin 12.0 - 16.0 g/dL 78.213.4 95.614.8 -  Hematocrit 35.0 - 47.0 % 39.3 43.5 41.1  Platelets 150 - 440 K/uL 222 214 -    No new chest x-ray  IMPRESSION: Acute hypoxemic respiratory failure COPD exacerbation with continued wheezing Smoker  PLAN/REC: Will leave in SDU today Continue systemic steroids at current dose through today, then prednisone beginning tomorrow Continue supplemental oxygen -wean as able maintaining SPO2 >90% Continue nebulized steroids and bronchodilators Complete 5 days a azithromycin Counseled again regarding smoking cessation   Upon discharge, she should complete a 7-10 day course of prednisone and start on Anoro or Stiolto inhaler as maintenance bronchodilator therapy. Also, will need albuterol rescue inhaler. I will arrange follow up with me in 3-4 weeks after discharge  Billy Fischeravid  Simonds, MD PCCM service Mobile [phone] Pager [phone] 12/01/2016 2:24 PM

## 2016-12-02 LAB — BASIC METABOLIC PANEL
Anion gap: 10 (ref 5–15)
BUN: 17 mg/dL (ref 6–20)
CHLORIDE: 101 mmol/L (ref 101–111)
CO2: 27 mmol/L (ref 22–32)
CREATININE: 0.51 mg/dL (ref 0.44–1.00)
Calcium: 8.6 mg/dL — ABNORMAL LOW (ref 8.9–10.3)
GFR calc Af Amer: >60 mL/min (ref >60)
Glucose, Bld: 171 mg/dL — ABNORMAL HIGH (ref 65–99)
Potassium: 3.6 mmol/L (ref 3.5–5.1)
SODIUM: 138 mmol/L (ref 135–145)

## 2016-12-02 LAB — CBC
HCT: 41.7 % (ref 35.0–47.0)
Hemoglobin: 14.3 g/dL (ref 12.0–16.0)
MCH: 31 pg (ref 26.0–34.0)
MCHC: 34.2 g/dL (ref 32.0–36.0)
MCV: 90.5 fL (ref 80.0–100.0)
PLATELETS: 264 10*3/uL (ref 150–440)
RBC: 4.6 MIL/uL (ref 3.80–5.20)
RDW: 12.9 % (ref 11.5–14.5)
WBC: 10.6 10*3/uL (ref 3.6–11.0)

## 2016-12-02 NOTE — Progress Notes (Signed)
1        [employer] [facility] [facility] [place] [facility]   PATIENT NAME: Carolyn Owens    MR#:  [id number]  DATE OF BIRTH:  1955-05-23  SUBJECTIVE:  CHIEF COMPLAINT:   Chief Complaint  Patient presents with  . Shortness of Breath  still hypoxic and SOB on minimal exertion, worried about her work (as she just moved to new building for work and has dust) REVIEW OF SYSTEMS:  Review of Systems  Constitutional: Positive for malaise/fatigue. Negative for chills, fever and weight loss.  HENT: Negative for nosebleeds and sore throat.   Eyes: Negative for blurred vision.  Respiratory: Positive for cough and shortness of breath. Negative for wheezing.   Cardiovascular: Negative for chest pain, orthopnea, leg swelling and PND.  Gastrointestinal: Negative for abdominal pain, constipation, diarrhea, heartburn, nausea and vomiting.  Genitourinary: Negative for dysuria and urgency.  Musculoskeletal: Negative for back pain.  Skin: Negative for rash.  Neurological: Positive for weakness. Negative for dizziness, speech change, focal weakness and headaches.  Endo/Heme/Allergies: Does not bruise/bleed easily.  Psychiatric/Behavioral: Negative for depression.   DRUG ALLERGIES:   Allergies  Allergen Reactions  . Adhesive [Tape]   . Bc Powder [Aspirin-Salicylamide-Caffeine]   . Sulfur    VITALS:  Blood pressure (!) 161/77, pulse 86, temperature 98 F (36.7 C), temperature source Oral, resp. rate 19, height 5\' 1"  (1.549 m), weight 62.8 kg (138 lb 7.2 oz), SpO2 93 %. PHYSICAL EXAMINATION:  Physical Exam  Constitutional: She is oriented to person, place, and time and well-developed, well-nourished, and in no distress.  HENT:  Head: Normocephalic and atraumatic.  Eyes: Conjunctivae and EOM are normal. Pupils are equal, round, and reactive to light.  Neck: Normal range of motion. Neck supple. No tracheal deviation present. No thyromegaly present.  Cardiovascular: Normal rate, regular rhythm  and normal heart sounds.  Pulmonary/Chest: Effort normal and breath sounds normal. No respiratory distress. She has no wheezes. She exhibits no tenderness.  Abdominal: Soft. Bowel sounds are normal. She exhibits no distension. There is no tenderness.  Musculoskeletal: Normal range of motion.  Neurological: She is alert and oriented to person, place, and time. No cranial nerve deficit.  Skin: Skin is warm and dry. No rash noted.  Psychiatric: Mood and affect normal.   LABORATORY PANEL:  Female CBC Recent Labs  Lab 12/02/16 0531  WBC 10.6  HGB 14.3  HCT 41.7  PLT 264   ------------------------------------------------------------------------------------------------------------------ Chemistries  Recent Labs  Lab 11/29/16 0003 11/29/16 2011  12/02/16 0531  NA 135  --    < > 138  K 2.5* 3.3*   < > 3.6  CL 96*  --    < > 101  CO2 27  --    < > 27  GLUCOSE 186*  --    < > 171*  BUN 12  --    < > 17  CREATININE 0.60  --    < > 0.51  CALCIUM 9.2  --    < > 8.6*  MG 1.7 1.8  --   --   AST 43*  --   --   --   ALT 39  --   --   --   ALKPHOS 79  --   --   --   BILITOT 0.6  --   --   --    < > = values in this interval not displayed.   RADIOLOGY:  No results found. ASSESSMENT AND PLAN:  1 acute COPD exacerbation -continue prednisone 40 mg daily and taper slowly - Zithromax for bronchitis, Pneumonia ruled out - wean O2 as tolerated but may need it at D/C as she desat's easily on minimal exertion - needs Anoro or Stiolto inhaler as maintenance bronchodilator therapy. Also, will need albuterol rescue inhaler at D/C  2 acute hypokalemia Repleted and Resolved  3 acute hyponatremia Resolved with hydration  4 chronic benign essential hypertension - continue Avapro, monitor  5 chronic hyperlipidemia, unspecified - continue zocor    Ok to transfer to floor if ok by PCCM   All the records are reviewed and case discussed with Care Management/Social Worker. Management  plans discussed with the patient, nursing and they are in agreement.  CODE STATUS: Full Code  TOTAL TIME TAKING CARE OF THIS PATIENT: 15 minutes.   More than 50% of the time was spent in counseling/coordination of care: YES  POSSIBLE D/C IN 1 DAYS, DEPENDING ON CLINICAL CONDITION.   Delfino LovettVipul Korine Winton M.D on 12/02/2016 at 3:16 PM  Between 7am to 6pm - Pager - [phone]  After 6pm go to [email or web address] - Social research officer, governmentpassword EPAS [facility]  [facility]  Office  [phone]  CC: Primary care physician; Tommie Samsook, Jayce G, DO  Note: This dictation was prepared with Dragon dictation along with smaller phrase technology. Any transcriptional errors that result from this process are unintentional.

## 2016-12-02 NOTE — Progress Notes (Signed)
[facility]    IMPRESSION: Acute hypoxemic respiratory failure due to COPD exacerbation with continued wheezing Smoker  PLAN/REC: Wean steroids.  Continue supplemental oxygen-wean as able maintaining SPO2 >90% Continue nebulized bronchodilators Complete 5 days a azithromycin Counseled again regarding smoking cessation greater than 3 minutes spent in discussion.   Upon discharge, she should complete a 7-10 day course of prednisone and start on Anoro or Stiolto inhaler as maintenance bronchodilator therapy. Also, will need albuterol rescue inhaler. Pt will be following up with Dr. Sung AmabileSimonds 3-4 weeks after discharge    Date: 12/02/2016  MRN# [id number] Carolyn Owens [birth year]   Carolyn Owens is a 61 y.o. old female seen in follow up for chief complaint of  Chief Complaint  Patient presents with  . Shortness of Breath     HPI:  Pt is currently on nebs, claritin, nicotine patch, and being transitioned to PO steroids.  Patient is doing well today, she has no active complaints.  She feels that her breathing is doing significantly better.  She is committed to quitting smoking completely.  Medication:    Current Facility-Administered Medications:  .  acetaminophen (TYLENOL) tablet 650 mg, 650 mg, Oral, Q6H PRN, 650 mg at 12/01/16 2112 **OR** [DISCONTINUED] acetaminophen (TYLENOL) suppository 650 mg, 650 mg, Rectal, Q6H PRN, Salary, Montell D, MD .  albuterol (PROVENTIL) (2.5 MG/3ML) 0.083% nebulizer solution 2.5 mg, 2.5 mg, Nebulization, Q3H PRN, Merwyn KatosSimonds, David B, MD, 2.5 mg at 11/29/16 1746 .  azithromycin (ZITHROMAX) tablet 250 mg, 250 mg, Oral, Daily, Merwyn KatosSimonds, David B, MD, 250 mg at 12/02/16 1021 .  budesonide (PULMICORT) nebulizer solution 0.5 mg, 0.5 mg, Nebulization, BID, Pyreddy, Pavan, MD, 0.5 mg at 12/02/16 0755 .  diphenhydrAMINE (BENADRYL) tablet 25 mg, 25 mg, Oral, QHS PRN, Merwyn KatosSimonds, David B, MD, 25 mg at 12/01/16 2110 .  docusate sodium (COLACE)  capsule 100 mg, 100 mg, Oral, BID, Pyreddy, Pavan, MD, 100 mg at 12/01/16 1029 .  enoxaparin (LOVENOX) injection 40 mg, 40 mg, Subcutaneous, Q24H, Salary, Montell D, MD, 40 mg at 12/02/16 0614 .  fluticasone (FLONASE) 50 MCG/ACT nasal spray 1 spray, 1 spray, Each Nare, Daily, Salary, Montell D, MD, 1 spray at 12/01/16 2132 .  guaiFENesin (MUCINEX) 12 hr tablet 600 mg, 600 mg, Oral, BID, Pyreddy, Pavan, MD, 600 mg at 12/02/16 1022 .  HYDROcodone-acetaminophen (NORCO/VICODIN) 5-325 MG per tablet 1-2 tablet, 1-2 tablet, Oral, Q4H PRN, Salary, Montell D, MD .  ipratropium-albuterol (DUONEB) 0.5-2.5 (3) MG/3ML nebulizer solution 3 mL, 3 mL, Nebulization, Q6H, Merwyn KatosSimonds, David B, MD, 3 mL at 12/02/16 0755 .  irbesartan (AVAPRO) tablet 150 mg, 150 mg, Oral, Daily, Oralia ManisWillis, David, MD, 150 mg at 12/02/16 0756 .  labetalol (NORMODYNE,TRANDATE) injection 10 mg, 10 mg, Intravenous, Q2H PRN, Oralia ManisWillis, David, MD, 10 mg at 12/01/16 2112 .  loratadine (CLARITIN) tablet 10 mg, 10 mg, Oral, Daily, Salary, Montell D, MD, 10 mg at 12/02/16 1022 .  multivitamin with minerals tablet 1 tablet, 1 tablet, Oral, Daily, Salary, Montell D, MD, 1 tablet at 12/02/16 1022 .  nicotine (NICODERM CQ - dosed in mg/24 hours) patch 14 mg, 14 mg, Transdermal, Daily, Salary, Montell D, MD, 14 mg at 12/02/16 1040 .  omega-3 acid ethyl esters (LOVAZA) capsule 1 g, 1 g, Oral, Daily, Salary, Montell D, MD, 1 g at 12/02/16 1022 .  [DISCONTINUED] ondansetron (ZOFRAN) tablet 4 mg, 4 mg, Oral, Q6H PRN **OR** ondansetron (ZOFRAN) injection 4 mg, 4 mg, Intravenous, Q6H PRN, Salary, Montell  D, MD .  pantoprazole (PROTONIX) EC tablet 40 mg, 40 mg, Oral, Daily, Salary, Montell D, MD, 40 mg at 12/02/16 1022 .  polyethylene glycol (MIRALAX / GLYCOLAX) packet 17 g, 17 g, Oral, Daily PRN, Salary, Montell D, MD .  potassium chloride SA (K-DUR,KLOR-CON) CR tablet 40 mEq, 40 mEq, Oral, BID, Merwyn Katos, MD, 40 mEq at 12/01/16 2110 .  predniSONE (DELTASONE)  tablet 40 mg, 40 mg, Oral, Q breakfast, Merwyn Katos, MD, 40 mg at 12/02/16 0755 .  sertraline (ZOLOFT) tablet 50 mg, 50 mg, Oral, Daily, Salary, Montell D, MD, 50 mg at 12/02/16 1021 .  simvastatin (ZOCOR) tablet 40 mg, 40 mg, Oral, q1800, Salary, Montell D, MD, 40 mg at 12/01/16 1711 .  sodium chloride flush (NS) 0.9 % injection 3 mL, 3 mL, Intravenous, Q12H, Salary, Montell D, MD, 3 mL at 12/02/16 1022   Allergies:  Adhesive [tape]; Bc powder [aspirin-salicylamide-caffeine]; and Sulfur  Review of Systems: Gen:  Denies  fever, sweats. HEENT: Denies blurred vision. Cvc:  No dizziness, chest pain or heaviness Resp:   Denies cough or sputum porduction. Gi: Denies swallowing difficulty, stomach pain. constipation, bowel incontinence Gu:  Denies bladder incontinence, burning urine Ext:   No Joint pain, stiffness. Skin: No skin rash, easy bruising. Endoc:  No polyuria, polydipsia. Psych: No depression, insomnia. Other:  All other systems were reviewed and found to be negative other than what is mentioned in the HPI.   Physical Examination:   VS: BP (!) 161/77 (BP Location: Right Arm)   Pulse 86   Temp 98 F (36.7 C) (Oral)   Resp 19   Ht 5\' 1"  (1.549 m)   Wt 138 lb 7.2 oz (62.8 kg)   SpO2 93%   BMI 26.16 kg/m    General Appearance: No distress  Neuro:without focal findings,  speech normal,  HEENT: PERRLA, EOM intact. Pulmonary: normal breath sounds, No wheezing.   CardiovascularNormal S1,S2.  No m/r/g.   Abdomen: Benign, Soft, non-tender. Renal:  No costovertebral tenderness  GU:  Not performed at this time. Endoc: No evident thyromegaly, no signs of acromegaly. Skin:   warm, no rash. Extremities: normal, no cyanosis, clubbing.   LABORATORY PANEL:   CBC Recent Labs  Lab 12/02/16 0531  WBC 10.6  HGB 14.3  HCT 41.7  PLT 264   ------------------------------------------------------------------------------------------------------------------  Chemistries    Recent Labs  Lab 11/29/16 0003 11/29/16 2011  12/02/16 0531  NA 135  --    < > 138  K 2.5* 3.3*   < > 3.6  CL 96*  --    < > 101  CO2 27  --    < > 27  GLUCOSE 186*  --    < > 171*  BUN 12  --    < > 17  CREATININE 0.60  --    < > 0.51  CALCIUM 9.2  --    < > 8.6*  MG 1.7 1.8  --   --   AST 43*  --   --   --   ALT 39  --   --   --   ALKPHOS 79  --   --   --   BILITOT 0.6  --   --   --    < > = values in this interval not displayed.   ------------------------------------------------------------------------------------------------------------------  Cardiac Enzymes Recent Labs  Lab 11/29/16 0003  TROPONINI <0.03   ------------------------------------------------------------  RADIOLOGY:   No results found for  this or any previous visit. No results found for this or any previous visit. ------------------------------------------------------------------------------------------------------------------  Thank  you for [facility] to assist in the care of your patient. Our recommendations are noted above.  Please contact us if we can be of further service.   Wells Guileseep Raif Chachere, MD.  [facility] Office Number: [phone]  Santiago Gladavid Kasa, M.D.  Billy Fischeravid Simonds, M.D  12/02/2016

## 2016-12-02 NOTE — Care Management Note (Signed)
Case Management Note  Patient Details  Name: Carolyn Owens MRN: [id number] Date of Birth: 23-Oct-1955  Subjective/Objective:                 Patient transferred out of icu to 2A.  Use of nebulizers and IV steroids has not resolved hypoxia.   Action/Plan:  Relayed need for home oxygen assessment   Expected Discharge Date:  12/01/16               Expected Discharge Plan:     In-House Referral:  PCP / Health Connect  Discharge planning Services  CM Consult  Post Acute Care Choice:    Choice offered to:  Patient  DME Arranged:    DME Agency:     HH Arranged:    HH Agency:     Status of Service:  In process, will continue to follow  If discussed at Long Length of Stay Meetings, dates discussed:    Additional Comments:  Carolyn Owens, Carolyn Frankland R, RN 12/02/2016, 1:38 PM

## 2016-12-02 NOTE — Progress Notes (Signed)
0945 pt's oxygen saturation dropped to 83 % while walking in hallway on room air. Took pt back to room and put back  Oxygen. Pt is currently stable at 93% oxygen saturation.

## 2016-12-02 NOTE — Plan of Care (Signed)
  Progressing Education: Knowledge of General Education information will improve 12/02/2016 0311 - Progressing by Dorna LeitzNesbitt, Sokha Craker M, RN Activity: Risk for activity intolerance will decrease 12/02/2016 0311 - Progressing by Dorna LeitzNesbitt, Taleah Bellantoni M, RN Activity: Ability to implement measures to reduce episodes of fatigue will improve 12/02/2016 0311 - Progressing by Dorna LeitzNesbitt, Glennette Galster M, RN Ability to tolerate increased activity will improve 12/02/2016 0311 - Progressing by Dorna LeitzNesbitt, Vannessa Godown M, RN Respiratory: Levels of oxygenation will improve 12/02/2016 0311 - Progressing by Dorna LeitzNesbitt, Ellard Nan M, RN

## 2016-12-03 LAB — BASIC METABOLIC PANEL
Anion gap: 13 (ref 5–15)
BUN: 16 mg/dL (ref 6–20)
CALCIUM: 8.6 mg/dL — AB (ref 8.9–10.3)
CO2: 27 mmol/L (ref 22–32)
CREATININE: 0.63 mg/dL (ref 0.44–1.00)
Chloride: 100 mmol/L — ABNORMAL LOW (ref 101–111)
GFR calc non Af Amer: >60 mL/min (ref >60)
Glucose, Bld: 138 mg/dL — ABNORMAL HIGH (ref 65–99)
Potassium: 2.7 mmol/L — CL (ref 3.5–5.1)
Sodium: 140 mmol/L (ref 135–145)

## 2016-12-03 LAB — MAGNESIUM: MAGNESIUM: 2.1 mg/dL (ref 1.7–2.4)

## 2016-12-03 LAB — CBC
HEMATOCRIT: 41.2 % (ref 35.0–47.0)
Hemoglobin: 13.9 g/dL (ref 12.0–16.0)
MCH: 30.4 pg (ref 26.0–34.0)
MCHC: 33.7 g/dL (ref 32.0–36.0)
MCV: 90.2 fL (ref 80.0–100.0)
Platelets: 289 10*3/uL (ref 150–440)
RBC: 4.57 MIL/uL (ref 3.80–5.20)
RDW: 12.7 % (ref 11.5–14.5)
WBC: 11.6 10*3/uL — ABNORMAL HIGH (ref 3.6–11.0)

## 2016-12-03 LAB — POTASSIUM: POTASSIUM: 3.7 mmol/L (ref 3.5–5.1)

## 2016-12-03 MED ORDER — MOMETASONE FURO-FORMOTEROL FUM 200-5 MCG/ACT IN AERO: 2.0000 | INHALATION_SPRAY | Freq: Two times a day (BID) | RESPIRATORY_TRACT | 0 refills | Status: DC

## 2016-12-03 MED ORDER — NICOTINE 14 MG/24HR TD PT24: 14.0000 mg | MEDICATED_PATCH | Freq: Every day | TRANSDERMAL | 0 refills | Status: DC

## 2016-12-03 MED ORDER — POTASSIUM CHLORIDE CRYS ER 20 MEQ PO TBCR
40.0000 meq | EXTENDED_RELEASE_TABLET | ORAL | Status: AC
  Administered 2016-12-03 (×2): 40 meq via ORAL
  Filled 2016-12-03 (×2): qty 2

## 2016-12-03 MED ORDER — PREDNISONE 10 MG PO TABS: 30.0000 mg | ORAL_TABLET | Freq: Every day | ORAL | 0 refills | Status: DC

## 2016-12-03 NOTE — Progress Notes (Addendum)
Went over discharge instructions with the patient including medications and follow-up appointments. Discontinue peripheral IV, patient no telemetry monitor. Home oxygen is delivered at bedside. Mykia NT will help patient to transport.

## 2016-12-03 NOTE — Plan of Care (Signed)
  Progressing Education: Knowledge of General Education information will improve 12/03/2016 1049 - Progressing by Tomie ChinaJackson, Pratik Dalziel Cecelie, RN Health Behavior/Discharge Planning: Ability to manage health-related needs will improve 12/03/2016 1049 - Progressing by Tomie ChinaJackson, Rashay Barnette Cecelie, RN Clinical Measurements: Ability to maintain clinical measurements within normal limits will improve 12/03/2016 1049 - Progressing by Tomie ChinaJackson, Reagen Goates Cecelie, RN Will remain free from infection 12/03/2016 1049 - Progressing by Tomie ChinaJackson, Milica Gully Cecelie, RN Diagnostic test results will improve 12/03/2016 1049 - Progressing by Tomie ChinaJackson, Ian Castagna Cecelie, RN Respiratory complications will improve 12/03/2016 1049 - Progressing by Tomie ChinaJackson, Detavious Rinn Cecelie, RN Cardiovascular complication will be avoided 12/03/2016 1049 - Progressing by Tomie ChinaJackson, Sophiea Ueda Cecelie, RN Activity: Risk for activity intolerance will decrease 12/03/2016 1049 - Progressing by Tomie ChinaJackson, Phil Michels Cecelie, RN Nutrition: Adequate nutrition will be maintained 12/03/2016 1049 - Progressing by Tomie ChinaJackson, Taras Rask Cecelie, RN Coping: Level of anxiety will decrease 12/03/2016 1049 - Progressing by Tomie ChinaJackson, Merrissa Giacobbe Cecelie, RN Elimination: Will not experience complications related to bowel motility 12/03/2016 1049 - Progressing by Tomie ChinaJackson, Kista Robb Cecelie, RN Will not experience complications related to urinary retention 12/03/2016 1049 - Progressing by Tomie ChinaJackson, Johannah Rozas Cecelie, RN Pain Managment: General experience of comfort will improve 12/03/2016 1049 - Progressing by Tomie ChinaJackson, Machel Violante Cecelie, RN Safety: Ability to remain free from injury will improve 12/03/2016 1049 - Progressing by Tomie ChinaJackson, Jibran Crookshanks Cecelie, RN Skin Integrity: Risk for impaired skin integrity will decrease 12/03/2016 1049 - Progressing by Tomie ChinaJackson, Chiann Goffredo Cecelie, RN Activity: Ability to implement measures to reduce episodes of fatigue will improve 12/03/2016 1049 -  Progressing by Tomie ChinaJackson, Ashima Shrake Cecelie, RN Ability to tolerate increased activity will improve 12/03/2016 1049 - Progressing by Tomie ChinaJackson, Felina Tello Cecelie, RN Education: Knowledge of disease or condition will improve 12/03/2016 1049 - Progressing by Tomie ChinaJackson, Bartlomiej Jenkinson Cecelie, RN Respiratory: Ability to maintain a clear airway will improve 12/03/2016 1049 - Progressing by Tomie ChinaJackson, Lu Paradise Cecelie, RN Levels of oxygenation will improve 12/03/2016 1049 - Progressing by Tomie ChinaJackson, Kinnley Paulson Cecelie, RN Ability to maintain adequate ventilation will improve 12/03/2016 1049 - Progressing by Tomie ChinaJackson, Zoya Sprecher Cecelie, RN Complications related to the disease process, condition or treatment will be avoided or minimized 12/03/2016 1049 - Progressing by Tomie ChinaJackson, Rayetta Veith Cecelie, RN

## 2016-12-03 NOTE — Progress Notes (Signed)
SATURATION QUALIFICATIONS: (This note is used to comply with regulatory documentation for home oxygen) ° °Patient Saturations on Room Air at Rest = 88% ° °Patient Saturations on Room Air while Ambulating = 86% ° °Patient Saturations on 2 Liters of oxygen while Ambulating = 91% ° °Please briefly explain why patient needs home oxygen: °

## 2016-12-03 NOTE — Care Management Note (Signed)
Case Management Note  Patient Details  Name: Carolyn Owens MRN: [id number] Date of Birth: [birth year]  Subjective/Objective:                 For discharge today with home oxygen. Has commericial insurance and medicaiton coverage.  Agency preference is [facility]   Action/Plan:   Home oxygen referral accepted.  Instructed patient she can not be provided with portable concentrator until she has completed her steroid taper  Expected Discharge Date:  12/03/16               Expected Discharge Plan:     In-House Referral:  PCP / Health Connect  Discharge planning Services  CM Consult  Post Acute Care Choice:    Choice offered to:  Patient  DME Arranged:  Oxygen DME Agency:     HH Arranged:    HH Agency:     Status of Service:  Completed, signed off  If discussed at [employer] Length of Stay Meetings, dates discussed:    Additional Comments:  [name], [name], RN 12/03/2016, 11:46 AM

## 2016-12-03 NOTE — Discharge Instructions (Signed)

## 2016-12-04 LAB — CULTURE, BLOOD (ROUTINE X 2)
CULTURE: NO GROWTH
CULTURE: NO GROWTH
SPECIAL REQUESTS: ADEQUATE
Special Requests: ADEQUATE

## 2016-12-04 NOTE — Discharge Summary (Signed)
[employer] [facility] [place] at [facility]   PATIENT NAME: Carolyn Owens    MR#:  [id number]  DATE OF BIRTH:  04/23/1955  DATE OF ADMISSION:  11/28/2016   ADMITTING PHYSICIAN: Ihor Austin, MD  DATE OF DISCHARGE: 12/03/2016  4:25 PM  PRIMARY CARE PHYSICIAN: Corky Downs, MD   ADMISSION DIAGNOSIS:  Hypokalemia [E87.6] SOB (shortness of breath) [R06.02] Hypoxia [R09.02] Sepsis, due to unspecified organism (HCC) [A41.9] Community acquired pneumonia, unspecified laterality [J18.9] COPD exacerbation (HCC) [J44.1] DISCHARGE DIAGNOSIS:  Active Problems:   COPD (chronic obstructive pulmonary disease) (HCC)   COPD exacerbation (HCC)  SECONDARY DIAGNOSIS:   Past Medical History:  Diagnosis Date  . Anxiety   . Chicken pox   . Glaucoma    suspect patient  . History of blood transfusion   . Hyperlipidemia   . Hypertension   . Multiple gastric ulcers    HOSPITAL COURSE:   1Acute COPD exacerbation - improving on nebs, LABA and steroids  2acute hypokalemia Repleted and Resolved  3acute hyponatremia Resolved with hydration  4chronic benign essential hypertension - continue Avapro  5chronic hyperlipidemia, unspecified - continue zocor DISCHARGE CONDITIONS:  stable CONSULTS OBTAINED:  Treatment Team:  Merwyn Katos, MD DRUG ALLERGIES:   Allergies  Allergen Reactions  . Adhesive [Tape]   . Bc Powder [Aspirin-Salicylamide-Caffeine]   . Sulfur    DISCHARGE MEDICATIONS:   Allergies as of 12/03/2016      Reactions   Adhesive [tape]    Bc Powder [aspirin-salicylamide-caffeine]    Sulfur       Medication List    TAKE these medications   cetirizine 10 MG tablet Commonly known as:  ZYRTEC Take 10 mg by mouth daily.   D3 ADULT PO Take 1,000 Units by mouth.   FISH OIL PO Take 1,400 mg by mouth.   fluticasone 50 MCG/ACT nasal spray Commonly known as:  FLONASE SPRAY 2 SPRAYS IN EACH NOSTRIL EVERY DAY   mometasone-formoterol 200-5  MCG/ACT Aero Commonly known as:  DULERA Inhale 2 puffs into the lungs 2 (two) times daily.   multivitamin tablet Take 1 tablet by mouth daily.   nicotine 14 mg/24hr patch Commonly known as:  NICODERM CQ - dosed in mg/24 hours Place 1 patch (14 mg total) onto the skin daily.   omeprazole 20 MG capsule Commonly known as:  PRILOSEC Take 20 mg by mouth 2 (two) times daily.   predniSONE 10 MG tablet Commonly known as:  DELTASONE Take 3 tablets (30 mg total) by mouth daily with breakfast. Start 30 mg po daily, taper 10 mg daily until done   PROAIR HFA 108 (90 Base) MCG/ACT inhaler Generic drug:  albuterol Place 2 puffs into the nose as needed.   sertraline 50 MG tablet Commonly known as:  ZOLOFT Take 50 mg by mouth daily.   simvastatin 40 MG tablet Commonly known as:  ZOCOR TAKE 1 TABLET (40 MG) BY ORAL ROUTE ONCE DAILY IN THE EVENING   valsartan-hydrochlorothiazide 160-12.5 MG tablet Commonly known as:  DIOVAN-HCT Take 1 tablet by mouth daily.        DISCHARGE INSTRUCTIONS:   DIET:  Regular diet DISCHARGE CONDITION:  Good ACTIVITY:  Activity as tolerated OXYGEN:  Home Oxygen: Yes.    Oxygen Delivery: 2 liters/min via Patient connected to nasal cannula oxygen DISCHARGE LOCATION:  home   If you experience worsening of your admission symptoms, develop shortness of breath, life threatening emergency, suicidal or homicidal thoughts you must seek medical attention immediately by  calling 911 or calling your MD immediately  if symptoms less severe.  You Must read complete instructions/literature along with all the possible adverse reactions/side effects for all the Medicines you take and that have been prescribed to you. Take any new Medicines after you have completely understood and accpet all the possible adverse reactions/side effects.   Please note  You were cared for by a hospitalist during your hospital stay. If you have any questions about your discharge  medications or the care you received while you were in the hospital after you are discharged, you can call the unit and asked to speak with the hospitalist on call if the hospitalist that took care of you is not available. Once you are discharged, your primary care physician will handle any further medical issues. Please note that NO REFILLS for any discharge medications will be authorized once you are discharged, as it is imperative that you return to your primary care physician (or establish a relationship with a primary care physician if you do not have one) for your aftercare needs so that they can reassess your need for medications and monitor your lab values.    On the day of Discharge:  VITAL SIGNS:  Blood pressure (!) 144/68, pulse 88, temperature (!) 97.4 F (36.3 C), temperature source Oral, resp. rate 18, height 5\' 1"  (1.549 m), weight 62.8 kg (138 lb 7.2 oz), SpO2 100 %. PHYSICAL EXAMINATION:  GENERAL:  61 y.o.-year-old patient lying in the bed with no acute distress.  EYES: Pupils equal, round, reactive to light and accommodation. No scleral icterus. Extraocular muscles intact.  HEENT: Head atraumatic, normocephalic. Oropharynx and nasopharynx clear.  NECK:  Supple, no jugular venous distention. No thyroid enlargement, no tenderness.  LUNGS: Normal breath sounds bilaterally, no wheezing, rales,rhonchi or crepitation. No use of accessory muscles of respiration.  CARDIOVASCULAR: S1, S2 normal. No murmurs, rubs, or gallops.  ABDOMEN: Soft, non-tender, non-distended. Bowel sounds present. No organomegaly or mass.  EXTREMITIES: No pedal edema, cyanosis, or clubbing.  NEUROLOGIC: Cranial nerves II through XII are intact. Muscle strength 5/5 in all extremities. Sensation intact. Gait not checked.  PSYCHIATRIC: The patient is alert and oriented x 3.  SKIN: No obvious rash, lesion, or ulcer.  DATA REVIEW:   CBC Recent Labs  Lab 12/03/16 0428  WBC 11.6*  HGB 13.9  HCT 41.2  PLT 289     Chemistries  Recent Labs  Lab 11/29/16 0003  12/03/16 0423 12/03/16 0428 12/03/16 1504  NA 135   < >  --  140  --   K 2.5*   < >  --  2.7* 3.7  CL 96*   < >  --  100*  --   CO2 27   < >  --  27  --   GLUCOSE 186*   < >  --  138*  --   BUN 12   < >  --  16  --   CREATININE 0.60   < >  --  0.63  --   CALCIUM 9.2   < >  --  8.6*  --   MG 1.7   < > 2.1  --   --   AST 43*  --   --   --   --   ALT 39  --   --   --   --   ALKPHOS 79  --   --   --   --   BILITOT 0.6  --   --   --   --    < > =  values in this interval not displayed.    Follow-up Information    Corky DownsMasoud, Javed, MD. Go on 12/16/2016.   Specialty:  Internal Medicine Why:  at [facility] Contact information: [address] [place] [address] [phone]        Schedule an appointment as soon as possible for a visit with Tommie Samsook, Jayce G, DO.   Specialty:  Family Medicine Why:  Please call to set up new patient appointment. Contact information: [address] [place] [address] [phone]        Merwyn KatosSimonds, David B, MD. Schedule an appointment as soon as possible for a visit on 01/05/2017.   Specialty:  Pulmonary Disease Why:  @10 :15 need chest xray no more then two days before apt. Contact information: [address] [place] [address] [phone]           Management plans discussed with the patient, family and they are in agreement.  CODE STATUS: Prior   TOTAL TIME TAKING CARE OF THIS PATIENT: 45 minutes.    Delfino LovettVipul Charmika Macdonnell M.D on 12/04/2016 at 12:12 PM  Between 7am to 6pm - Pager - [phone]  After 6pm go to [email or web address] - Social research officer, governmentpassword [email or web address]  [facility] [place] [facility]  Office  [phone]  CC: Primary care physician; Corky DownsMasoud, Javed, MD   Note: This dictation was prepared with Dragon dictation along with smaller phrase technology. Any transcriptional errors that result from this process are unintentional.

## 2016-12-06 ENCOUNTER — Telehealth: Payer: Self-pay | Admitting: Pulmonary Disease

## 2016-12-06 NOTE — Telephone Encounter (Signed)
Patient was discharged from hospital and was told to limit activity She needs some more information about what to do for work Will be seeing Simonds 1/2 Please advise

## 2016-12-06 NOTE — Telephone Encounter (Signed)
She may return to work and should advance her activity back to her previous level as she is able. There is no reason to "limit activity" other than avoid exertion to the point of severe shortness of breath or fatigue  Carolyn Owens

## 2016-12-06 NOTE — Telephone Encounter (Signed)
Please advise on message below Thanks

## 2016-12-06 NOTE — Telephone Encounter (Signed)
Pt verbalizied understanding. Nothing further needed.

## 2016-12-16 DIAGNOSIS — R0689 Other abnormalities of breathing: Secondary | ICD-10-CM | POA: Diagnosis not present

## 2016-12-16 DIAGNOSIS — E785 Hyperlipidemia, unspecified: Secondary | ICD-10-CM | POA: Diagnosis not present

## 2016-12-16 DIAGNOSIS — E889 Metabolic disorder, unspecified: Secondary | ICD-10-CM | POA: Diagnosis not present

## 2016-12-16 DIAGNOSIS — I10 Essential (primary) hypertension: Secondary | ICD-10-CM | POA: Diagnosis not present

## 2016-12-17 ENCOUNTER — Institutional Professional Consult (permissible substitution): Payer: BLUE CROSS/BLUE SHIELD | Admitting: Internal Medicine

## 2016-12-23 DIAGNOSIS — R079 Chest pain, unspecified: Secondary | ICD-10-CM | POA: Diagnosis not present

## 2016-12-23 DIAGNOSIS — E785 Hyperlipidemia, unspecified: Secondary | ICD-10-CM | POA: Diagnosis not present

## 2016-12-23 DIAGNOSIS — I10 Essential (primary) hypertension: Secondary | ICD-10-CM | POA: Diagnosis not present

## 2016-12-30 ENCOUNTER — Ambulatory Visit
Admission: RE | Admit: 2016-12-30 | Discharge: 2016-12-30 | Disposition: A | Discharge status: Home / Self Care | Payer: BLUE CROSS/BLUE SHIELD | Source: Ambulatory Visit | Attending: Pulmonary Disease | Admitting: Pulmonary Disease

## 2016-12-30 DIAGNOSIS — R918 Other nonspecific abnormal finding of lung field: Secondary | ICD-10-CM | POA: Diagnosis not present

## 2016-12-30 DIAGNOSIS — F17200 Nicotine dependence, unspecified, uncomplicated: Secondary | ICD-10-CM | POA: Diagnosis not present

## 2016-12-30 DIAGNOSIS — J189 Pneumonia, unspecified organism: Secondary | ICD-10-CM

## 2016-12-30 DIAGNOSIS — J9691 Respiratory failure, unspecified with hypoxia: Secondary | ICD-10-CM | POA: Insufficient documentation

## 2016-12-30 DIAGNOSIS — J441 Chronic obstructive pulmonary disease with (acute) exacerbation: Secondary | ICD-10-CM | POA: Insufficient documentation

## 2016-12-30 DIAGNOSIS — Z8701 Personal history of pneumonia (recurrent): Secondary | ICD-10-CM | POA: Diagnosis not present

## 2017-01-05 ENCOUNTER — Encounter: Payer: Self-pay | Admitting: Pulmonary Disease

## 2017-01-05 ENCOUNTER — Ambulatory Visit (INDEPENDENT_AMBULATORY_CARE_PROVIDER_SITE_OTHER): Payer: BLUE CROSS/BLUE SHIELD | Admitting: Pulmonary Disease

## 2017-01-05 VITALS — BP 106/70 | HR 86 | Resp 16 | Ht 61.0 in | Wt 137.0 lb

## 2017-01-05 DIAGNOSIS — J45909 Unspecified asthma, uncomplicated: Secondary | ICD-10-CM | POA: Diagnosis not present

## 2017-01-05 DIAGNOSIS — M654 Radial styloid tenosynovitis [de Quervain]: Secondary | ICD-10-CM | POA: Insufficient documentation

## 2017-01-05 DIAGNOSIS — M7710 Lateral epicondylitis, unspecified elbow: Secondary | ICD-10-CM | POA: Insufficient documentation

## 2017-01-05 DIAGNOSIS — F17200 Nicotine dependence, unspecified, uncomplicated: Secondary | ICD-10-CM

## 2017-01-05 NOTE — Progress Notes (Signed)
PULMONARY POST HOSPITAL FOLLOW-UP NOTE  [facility]ing MD/Service: [facility] hospital[facility] Date of initial consultation: 11/29/16 Reason for consultation: COPD exacerbation  PT PROFILE: 62 y.o. female smoker hospitalized 11/27-11/30/18 with COPD exacerbation.  Seen in consultation by pulmonary medicine 11/29/16.  Recovered nicely after that hospitalization.  Continues to smoke.   DATA:   SUBJ:  No major problems since discharge.  She has resumed smoking and is presently smoking approximately 1/2 pack cigarettes per day.  Previously she smoked a pack of cigarettes per day on average.  She initially started smoking in her mid 9120s.  She has made no concerted efforts to quit since starting.  She [facility] but is not using this.  She is completed a prednisone taper.  She was discharged home on an albuterol rescue inhaler but is also not using this.  Presently, she believes that she is back to her baseline.  She denies CP, fever, purulent sputum, hemoptysis, LE edema and calf tenderness.   Vitals:   01/05/17 1014 01/05/17 1021  BP:  106/70  Pulse:  86  Resp: 16   SpO2:  96%  Weight: 62.1 kg (137 lb)   Height: 5\' 1"  (1.549 m)      EXAM:  Gen: NAD HEENT: NCAT, sclera white, poor dentition Neck: No JVD Lungs: breath sounds mildly diminished, no wheezes or other adventitious sounds Cardiovascular: RRR, no murmurs Abdomen: Soft, nontender, normal BS Ext: without clubbing, cyanosis, edema Neuro: grossly intact Skin: Limited exam, no lesions noted   DATA:   BMP Latest Ref Rng & Units 12/03/2016 12/03/2016 12/02/2016  Glucose 65 - 99 mg/dL - 962(X138(H) 528(U171(H)  BUN 6 - 20 mg/dL - 16 17  Creatinine 1.320.44 - 1.00 mg/dL - 4.400.63 1.020.51  Sodium 725135 - 145 mmol/L - 140 138  Potassium 3.5 - 5.1 mmol/L 3.7 2.7(LL) 3.6  Chloride 101 - 111 mmol/L - 100(L) 101  CO2 22 - 32 mmol/L - 27 27  Calcium 8.9 - 10.3 mg/dL - 8.6(L) 8.6(L)    CBC Latest Ref Rng & Units 12/03/2016 12/02/2016  11/30/2016  WBC 3.6 - 11.0 K/uL 11.6(H) 10.6 14.7(H)  Hemoglobin 12.0 - 16.0 g/dL 36.613.9 44.014.3 34.713.4  Hematocrit 35.0 - 47.0 % 41.2 41.7 39.3  Platelets 150 - 440 K/uL 289 264 222    CXR 12/30/16: Mild chronic interstitial prominence.  No acute findings  IMPRESSION:     ICD-10-CM   1. Acute asthmatic bronchitis J45.909 Pulmonary Function Test [facility] Only  2. Smoker F17.200 Pulmonary Function Test [facility] Only   Probable underlying COPD.  Recent acute asthmatic bronchitis has resolved.  PLAN:  Smoking cessation was discussed in detail including the risks associated with continued smoking, the benefits of smoking cessation and strategies that might be helpful. After thorough consideration, she is to make an honest attempt at cessation on her own.  If this fails, we will prescribe Chantix  For now, we will leave her off of inhaler therapies at least until follow-up appointment  To follow-up in 3-4 weeks with PFTs prior to that visit.  Billy Fischeravid Lorin Hauck, MD PCCM service Mobile [phone] Pager [phone] 01/05/2017 4:39 PM

## 2017-01-05 NOTE — Patient Instructions (Signed)
Smoking cessation as we discussed Follow-up in 3-4 weeks with lung function test prior to that visit We will discuss Chantix at next visit if you have been unable to quit smoking on your own

## 2017-01-14 ENCOUNTER — Encounter: Payer: Self-pay | Admitting: General Surgery

## 2017-01-19 ENCOUNTER — Ambulatory Visit: Payer: Self-pay | Admitting: General Surgery

## 2017-01-21 DIAGNOSIS — Z85828 Personal history of other malignant neoplasm of skin: Secondary | ICD-10-CM | POA: Diagnosis not present

## 2017-02-01 ENCOUNTER — Encounter: Payer: Self-pay | Admitting: *Deleted

## 2017-02-16 DIAGNOSIS — H40003 Preglaucoma, unspecified, bilateral: Secondary | ICD-10-CM | POA: Diagnosis not present

## 2017-02-18 ENCOUNTER — Telehealth: Payer: Self-pay | Admitting: Pulmonary Disease

## 2017-02-18 NOTE — Telephone Encounter (Signed)
LMOAM home number and home number for pt to return my call to [phone]. [name]

## 2017-02-18 NOTE — Telephone Encounter (Signed)
LMOAM for pt to return call to schedule PFT and ROV. Waiting on patient to return call. [name]

## 2017-02-18 NOTE — Telephone Encounter (Signed)
Patient is overdue for f/u  Needs a PFT prior to f/u appt

## 2017-02-21 NOTE — Telephone Encounter (Signed)
LMOAM (home) # for pt to return my call to schedule PFT and ROV. [name]

## 2017-02-21 NOTE — Telephone Encounter (Signed)
LMOAM for pt to return my call to schedule PFT and ROV. [name] ° °

## 2017-02-23 ENCOUNTER — Encounter: Payer: Self-pay | Admitting: Pulmonary Disease

## 2017-02-23 NOTE — Telephone Encounter (Signed)
LMOAM on both the home # and the cell # for pt to return my call to schedule PFT and ROV. Carolyn Owens

## 2017-02-24 NOTE — Telephone Encounter (Signed)
Unable to reach patient after calling and leaving messages on her home number and cell number both.  Called x 4 both morning and afternoon.  Letter mailed to patient to contact the office to schedule. [name] This note has been closed and waiting on patient to receive letter to contact office to schedule. [name] Pt needs PFT and Hospital Follow up Appointment. [name]

## 2017-04-01 DIAGNOSIS — H40003 Preglaucoma, unspecified, bilateral: Secondary | ICD-10-CM | POA: Diagnosis not present

## 2017-04-18 ENCOUNTER — Other Ambulatory Visit: Payer: Self-pay | Admitting: Family Medicine

## 2017-04-18 DIAGNOSIS — Z1231 Encounter for screening mammogram for malignant neoplasm of breast: Secondary | ICD-10-CM

## 2017-05-02 ENCOUNTER — Telehealth: Payer: Self-pay | Admitting: Pulmonary Disease

## 2017-05-02 NOTE — Telephone Encounter (Signed)
Per patient:   Please do not contact patient anymore.  She is not wanting to do any more follow up or testing.  Okay Per Dr Sung Amabile

## 2017-05-12 ENCOUNTER — Other Ambulatory Visit: Payer: Self-pay | Admitting: Family Medicine

## 2017-05-12 DIAGNOSIS — Z1231 Encounter for screening mammogram for malignant neoplasm of breast: Secondary | ICD-10-CM

## 2017-06-27 ENCOUNTER — Ambulatory Visit
Admission: RE | Admit: 2017-06-27 | Discharge: 2017-06-27 | Disposition: A | Discharge status: Home / Self Care | Payer: BLUE CROSS/BLUE SHIELD | Source: Ambulatory Visit | Attending: Family Medicine | Admitting: Family Medicine

## 2017-06-27 DIAGNOSIS — Z1231 Encounter for screening mammogram for malignant neoplasm of breast: Secondary | ICD-10-CM | POA: Diagnosis not present

## 2017-10-13 DIAGNOSIS — H00015 Hordeolum externum left lower eyelid: Secondary | ICD-10-CM | POA: Diagnosis not present

## 2017-10-21 DIAGNOSIS — H40003 Preglaucoma, unspecified, bilateral: Secondary | ICD-10-CM | POA: Diagnosis not present

## 2018-01-20 DIAGNOSIS — D2261 Melanocytic nevi of right upper limb, including shoulder: Secondary | ICD-10-CM | POA: Diagnosis not present

## 2018-01-20 DIAGNOSIS — D2262 Melanocytic nevi of left upper limb, including shoulder: Secondary | ICD-10-CM | POA: Diagnosis not present

## 2018-01-20 DIAGNOSIS — D2272 Melanocytic nevi of left lower limb, including hip: Secondary | ICD-10-CM | POA: Diagnosis not present

## 2018-01-20 DIAGNOSIS — Z85828 Personal history of other malignant neoplasm of skin: Secondary | ICD-10-CM | POA: Diagnosis not present

## 2018-10-09 ENCOUNTER — Other Ambulatory Visit: Payer: Self-pay | Admitting: Family Medicine

## 2018-10-09 DIAGNOSIS — Z1231 Encounter for screening mammogram for malignant neoplasm of breast: Secondary | ICD-10-CM

## 2019-04-20 DIAGNOSIS — D2261 Melanocytic nevi of right upper limb, including shoulder: Secondary | ICD-10-CM | POA: Diagnosis not present

## 2019-04-20 DIAGNOSIS — D2262 Melanocytic nevi of left upper limb, including shoulder: Secondary | ICD-10-CM | POA: Diagnosis not present

## 2019-04-20 DIAGNOSIS — D2272 Melanocytic nevi of left lower limb, including hip: Secondary | ICD-10-CM | POA: Diagnosis not present

## 2019-04-20 DIAGNOSIS — Z85828 Personal history of other malignant neoplasm of skin: Secondary | ICD-10-CM | POA: Diagnosis not present

## 2019-05-28 ENCOUNTER — Ambulatory Visit
Admission: RE | Admit: 2019-05-28 | Discharge: 2019-05-28 | Disposition: A | Discharge status: Home / Self Care | Payer: BC Managed Care – PPO | Source: Ambulatory Visit | Attending: Family Medicine | Admitting: Family Medicine

## 2019-05-28 DIAGNOSIS — Z1231 Encounter for screening mammogram for malignant neoplasm of breast: Secondary | ICD-10-CM | POA: Diagnosis not present

## 2019-06-22 DIAGNOSIS — H40003 Preglaucoma, unspecified, bilateral: Secondary | ICD-10-CM | POA: Diagnosis not present

## 2019-06-28 DIAGNOSIS — H40003 Preglaucoma, unspecified, bilateral: Secondary | ICD-10-CM | POA: Diagnosis not present

## 2019-08-13 ENCOUNTER — Ambulatory Visit: Payer: BC Managed Care – PPO | Admitting: Internal Medicine

## 2019-08-13 ENCOUNTER — Other Ambulatory Visit: Payer: Self-pay

## 2019-08-13 ENCOUNTER — Encounter: Payer: Self-pay | Admitting: Internal Medicine

## 2019-08-13 VITALS — BP 136/81 | HR 92 | Ht 60.0 in | Wt 127.2 lb

## 2019-08-13 DIAGNOSIS — E782 Mixed hyperlipidemia: Secondary | ICD-10-CM

## 2019-08-13 DIAGNOSIS — K219 Gastro-esophageal reflux disease without esophagitis: Secondary | ICD-10-CM

## 2019-08-13 DIAGNOSIS — J449 Chronic obstructive pulmonary disease, unspecified: Secondary | ICD-10-CM | POA: Diagnosis not present

## 2019-08-13 DIAGNOSIS — Z720 Tobacco use: Secondary | ICD-10-CM

## 2019-08-13 DIAGNOSIS — I10 Essential (primary) hypertension: Secondary | ICD-10-CM

## 2019-08-13 DIAGNOSIS — F419 Anxiety disorder, unspecified: Secondary | ICD-10-CM

## 2019-08-13 NOTE — Assessment & Plan Note (Signed)
-   I instructed the patient to stop smoking and provided them with smoking cessation materials.  - I informed the patient that smoking puts them at increased risk for cancer, COPD, hypertension, and more.  - Informed the patient to seek help if they begin to have trouble breathing, develop chest pain, start to cough up blood, feel faint, or pass out.  

## 2019-08-13 NOTE — Assessment & Plan Note (Signed)
-   The patient's GERD is stable on medication.  - Instructed the patient to avoid eating spicy and acidic foods, as well as foods high in fat. - Instructed the patient to avoid eating large meals or meals 2-3 hours prior to sleeping. 

## 2019-08-13 NOTE — Assessment & Plan Note (Signed)
-   Patient experiencing high levels of anxiety.  - Encouraged patient to engage in relaxing activities like yoga, meditation, journaling, going for a walk, or participating in a hobby.  - Encouraged patient to reach out to trusted friends or family members about recent struggles 

## 2019-08-13 NOTE — Progress Notes (Signed)
Established Patient Office Visit  SUBJECTIVE:  Subjective  Patient ID: Carolyn Owens, female    DOB: 07-07-1955  Age: 64 y.o. MRN: [id number]  CC: No chief complaint on file.   HPI Carolyn Owens is a 64 y.o. female presenting today for a workplace accomodation request letter.   She was last seen previously in 2018 s/p hospital stay. She was admitted with SOB, hypoxia, and pneumonia. She has COPD secondary to smoking. At this time she does not take anything for her breathing. In the interim, she has been seeing the nurse at her workplace, [employer]. She notes that she has not needed to see a doctor since then since she gets a CBC, CMP, and physical through her workplace yearly.   At this time she would like to work from home due to the ongoing COVID-19 crisis, but she needs a doctor's note for HR for them to approve it. This is not something that the nurse at her workplace can sign off for her.   She is taking her prescribed medications as directed without issues and denies any missed doses.   She is vaccinated against COVID-19. She has had her pneumonia and shingles vaccines as well.   She completed a 6 minute walk and had to be stopped because her O2 was 84%. Starting O2 was 92%. She denied feeling out of breath and states that she could have continued walking.     Past Medical History:  Diagnosis Date  . Anxiety   . Chicken pox   . Glaucoma    suspect patient  . History of blood transfusion   . Hyperlipidemia   . Hypertension   . Multiple gastric ulcers     Past Surgical History:  Procedure Laterality Date  . ankle bone infection Right 1965  . HERNIA REPAIR  2005  . PARTIAL HYSTERECTOMY  2004/2005  . VAGINAL HYSTERECTOMY  2004   Has ovaries (no oophorectomy).  . WRIST GANGLION EXCISION Right 1983    Family History  Problem Relation Age of Onset  . Lung cancer Father   . Hyperlipidemia Father   . Hypertension Father   . Hyperlipidemia Mother   .  Hypertension Mother   . Pancreatic cancer Mother   . Diabetes Mother   . Multiple myeloma Paternal Grandfather   . Liver cancer Maternal Aunt   . Breast cancer Paternal Aunt   . Brain cancer Maternal Uncle     Social History   Socioeconomic History  . Marital status: Divorced    Spouse name: Not on file  . Number of children: Not on file  . Years of education: Not on file  . Highest education level: Not on file  Occupational History  . Not on file  Tobacco Use  . Smoking status: Current Every Day Smoker    Packs/day: 0.50  . Smokeless tobacco: Never Used  Substance and Sexual Activity  . Alcohol use: Yes    Alcohol/week: 1.0 - 2.0 standard drink    Types: 1 - 2 Standard drinks or equivalent per week  . Drug use: Not on file  . Sexual activity: Not on file  Other Topics Concern  . Not on file  Social History Narrative  . Not on file   Social Determinants of Health   Financial Resource Strain:   . Difficulty of Paying Living Expenses:   Food Insecurity:   . Worried About Charity fundraiser in the Last Year:   . [employer]  of Food in the Last Year:   Transportation Needs:   . Film/video editor (Medical):   Marland Kitchen Lack of Transportation (Non-Medical):   Physical Activity:   . Days of Exercise per Week:   . Minutes of Exercise per Session:   Stress:   . Feeling of Stress :   Social Connections:   . Frequency of Communication with Friends and Family:   . Frequency of Social Gatherings with Friends and Family:   . Attends Religious Services:   . Active Member of Clubs or Organizations:   . Attends Archivist Meetings:   Marland Kitchen Marital Status:   Intimate Partner Violence:   . Fear of Current or Ex-Partner:   . Emotionally Abused:   Marland Kitchen Physically Abused:   . Sexually Abused:      Current Outpatient Medications:  .  cetirizine (ZYRTEC) 10 MG tablet, Take 10 mg by mouth daily., Disp: , Rfl: 3 .  fluticasone (FLONASE) 50 MCG/ACT nasal spray, SPRAY 2 SPRAYS IN  EACH NOSTRIL EVERY DAY, Disp: , Rfl: 4 .  Multiple Vitamin (MULTIVITAMIN) tablet, Take 1 tablet by mouth daily., Disp: , Rfl:  .  omeprazole (PRILOSEC) 20 MG capsule, Take 20 mg by mouth 2 (two) times daily., Disp: , Rfl: 3 .  PROAIR HFA 108 (90 Base) MCG/ACT inhaler, Place 2 puffs into the nose as needed., Disp: , Rfl: 1 .  sertraline (ZOLOFT) 50 MG tablet, Take 50 mg by mouth daily., Disp: , Rfl: 4 .  simvastatin (ZOCOR) 40 MG tablet, TAKE 1 TABLET (40 MG) BY ORAL ROUTE ONCE DAILY IN THE EVENING, Disp: , Rfl: 3 .  valsartan-hydrochlorothiazide (DIOVAN-HCT) 160-12.5 MG tablet, Take 1 tablet by mouth daily., Disp: , Rfl: 3   Allergies  Allergen Reactions  . Adhesive [Tape]   . Bc Powder [Aspirin-Salicylamide-Caffeine]   . Sulfur     ROS Review of Systems  Constitutional: Negative.   HENT: Negative.   Eyes: Negative.   Respiratory: Negative.  Negative for chest tightness and shortness of breath.   Cardiovascular: Negative.   Gastrointestinal: Negative.   Endocrine: Negative.   Genitourinary: Negative.   Musculoskeletal: Negative.   Skin: Negative.   Allergic/Immunologic: Negative.   Neurological: Negative.   Hematological: Negative.   Psychiatric/Behavioral: Negative.  The patient is not nervous/anxious.   All other systems reviewed and are negative.    OBJECTIVE:    Physical Exam Vitals reviewed.  Constitutional:      Appearance: Normal appearance.  HENT:     Mouth/Throat:     Mouth: Mucous membranes are moist.  Eyes:     Pupils: Pupils are equal, round, and reactive to light.  Neck:     Vascular: No carotid bruit.  Cardiovascular:     Rate and Rhythm: Normal rate and regular rhythm.     Pulses: Normal pulses.     Heart sounds: Normal heart sounds.  Pulmonary:     Effort: Pulmonary effort is normal.     Breath sounds: Wheezing present.  Abdominal:     General: Bowel sounds are normal.     Palpations: Abdomen is soft. There is no hepatomegaly, splenomegaly or  mass.     Tenderness: There is no abdominal tenderness.     Hernia: No hernia is present.  Musculoskeletal:        General: No tenderness.     Cervical back: Neck supple.     Right lower leg: No edema.     Left lower leg: No edema.  Skin:    Findings: No rash.  Neurological:     Mental Status: She is alert and oriented to person, place, and time.     Motor: No weakness.  Psychiatric:        Mood and Affect: Mood and affect normal.        Behavior: Behavior normal.     BP 136/81   Pulse 92   Ht 5' (1.524 m)   Wt 127 lb 3.2 oz (57.7 kg)   BMI 24.84 kg/m  Wt Readings from Last 3 Encounters:  08/13/19 127 lb 3.2 oz (57.7 kg)  01/05/17 137 lb (62.1 kg)  11/29/16 138 lb 7.2 oz (62.8 kg)    Health Maintenance Due  Topic Date Due  . Hepatitis C Screening  Never done  . COVID-19 Vaccine (1) Never done  . COLONOSCOPY  Never done  . PAP SMEAR-Modifier  04/02/2017  . TETANUS/TDAP  02/02/2018  . INFLUENZA VACCINE  08/05/2019    There are no preventive care reminders to display for this patient.  CBC Latest Ref Rng & Units 12/03/2016 12/02/2016 11/30/2016  WBC 3.6 - 11.0 K/uL 11.6(H) 10.6 14.7(H)  Hemoglobin 12.0 - 16.0 g/dL 13.9 14.3 13.4  Hematocrit 35 - 47 % 41.2 41.7 39.3  Platelets 150 - 440 K/uL 289 264 222   CMP Latest Ref Rng & Units 12/03/2016 12/03/2016 12/02/2016  Glucose 65 - 99 mg/dL - 138(H) 171(H)  BUN 6 - 20 mg/dL - 16 17  Creatinine 0.44 - 1.00 mg/dL - 0.63 0.51  Sodium 135 - 145 mmol/L - 140 138  Potassium 3.5 - 5.1 mmol/L 3.7 2.7(LL) 3.6  Chloride 101 - 111 mmol/L - 100(L) 101  CO2 22 - 32 mmol/L - 27 27  Calcium 8.9 - 10.3 mg/dL - 8.6(L) 8.6(L)  Total Protein 6.5 - 8.1 g/dL - - -  Total Bilirubin 0.3 - 1.2 mg/dL - - -  Alkaline Phos 38 - 126 U/L - - -  AST 15 - 41 U/L - - -  ALT 14 - 54 U/L - - -    Lab Results  Component Value Date   TSH 0.88 02/25/2014   Lab Results  Component Value Date   ALBUMIN 4.0 11/29/2016   ANIONGAP 13 12/03/2016     Lab Results  Component Value Date   CHOL 193 02/25/2014   HDL 60 02/25/2014   LDLCALC 101 02/25/2014   Lab Results  Component Value Date   TRIG 158 02/25/2014   Lab Results  Component Value Date   HGBA1C 6.0 02/25/2014      ASSESSMENT & PLAN:   Problem List Items Addressed This Visit      Cardiovascular and Mediastinum   HTN (hypertension)    - Today, the patient's blood pressure is well managed on losartin. - The patient will continue the current treatment regimen.  - I encouraged the patient to eat a low-sodium diet to help control blood pressure. - I encouraged the patient to live an active lifestyle and complete activities that increases heart rate to 85% target heart rate at least 5 times per week for one hour.            Respiratory   COPD (chronic obstructive pulmonary disease) (HCC) - Primary    Pt has COPD due to smoking. It was discussed with her to stop smoking. In the office she was walked for three minutes and her O2 saturation dropped from 92% to 88% I advised her to use an  inhaler on a regular basis, walk everyday. But don't do anything strenuous. She has a history of pneumonia in 2018. It was advised that she should work from home at least for the next 6 months and after that she can work 3 days in the office and 2 days at home if it is agreeable with her boss and her company.          Digestive   GERD (gastroesophageal reflux disease)    - The patient's GERD is stable on medication.  - Instructed the patient to avoid eating spicy and acidic foods, as well as foods high in fat. - Instructed the patient to avoid eating large meals or meals 2-3 hours prior to sleeping.         Other   Hyperlipidemia    Pt is on statin      Tobacco abuse    - I instructed the patient to stop smoking and provided them with smoking cessation materials.  - I informed the patient that smoking puts them at increased risk for cancer, COPD, hypertension, and more.  -  Informed the patient to seek help if they begin to have trouble breathing, develop chest pain, start to cough up blood, feel faint, or pass out.        Anxiety    - Patient experiencing high levels of anxiety.  - Encouraged patient to engage in relaxing activities like yoga, meditation, journaling, going for a walk, or participating in a hobby.  - Encouraged patient to reach out to trusted friends or family members about recent struggles         No orders of the defined types were placed in this encounter.    Follow-up: No follow-ups on file.    Dr. Jane Canary [facility] [address], [place], [place] [address]   By signing my name below, I, [employer], attest that this documentation has been prepared under the direction and in the presence of Cletis Athens, MD. Electronically Signed: Cletis Athens, MD 08/13/19, 10:37 AM   I personally performed the services described in this documentation, which was SCRIBED in my presence. The recorded information has been reviewed and considered accurate. It has been edited as necessary during review. Cletis Athens, MD

## 2019-08-13 NOTE — Assessment & Plan Note (Signed)
Pt is on statin.  

## 2019-08-13 NOTE — Assessment & Plan Note (Signed)
Pt has COPD due to smoking. It was discussed with her to stop smoking. In the office she was walked for three minutes and her O2 saturation dropped from 92% to 88% I advised her to use an inhaler on a regular basis, walk everyday. But don't do anything strenuous. She has a history of pneumonia in 2018. It was advised that she should work from home at least for the next 6 months and after that she can work 3 days in the office and 2 days at home if it is agreeable with her boss and her company.

## 2019-08-13 NOTE — Assessment & Plan Note (Signed)
-   Today, the patient's blood pressure is well managed on losartin. - The patient will continue the current treatment regimen.  - I encouraged the patient to eat a low-sodium diet to help control blood pressure. - I encouraged the patient to live an active lifestyle and complete activities that increases heart rate to 85% target heart rate at least 5 times per week for one hour.  

## 2019-08-15 ENCOUNTER — Other Ambulatory Visit: Payer: Self-pay

## 2019-08-15 MED ORDER — ANORO ELLIPTA 62.5-25 MCG/INH IN AEPB: 1.0000 | INHALATION_SPRAY | Freq: Every day | RESPIRATORY_TRACT | 3 refills | Status: AC

## 2019-10-05 DIAGNOSIS — Z23 Encounter for immunization: Secondary | ICD-10-CM | POA: Diagnosis not present

## 2019-10-18 ENCOUNTER — Encounter: Payer: Self-pay | Admitting: Family Medicine

## 2019-10-18 ENCOUNTER — Ambulatory Visit (INDEPENDENT_AMBULATORY_CARE_PROVIDER_SITE_OTHER): Payer: BC Managed Care – PPO | Admitting: Family Medicine

## 2019-10-18 ENCOUNTER — Other Ambulatory Visit: Payer: Self-pay

## 2019-10-18 VITALS — BP 129/74 | HR 91 | Ht 60.0 in | Wt 126.6 lb

## 2019-10-18 DIAGNOSIS — Z720 Tobacco use: Secondary | ICD-10-CM

## 2019-10-18 DIAGNOSIS — I10 Essential (primary) hypertension: Secondary | ICD-10-CM

## 2019-10-18 DIAGNOSIS — J449 Chronic obstructive pulmonary disease, unspecified: Secondary | ICD-10-CM | POA: Diagnosis not present

## 2019-10-18 DIAGNOSIS — Z0000 Encounter for general adult medical examination without abnormal findings: Secondary | ICD-10-CM | POA: Diagnosis not present

## 2019-10-18 NOTE — Assessment & Plan Note (Addendum)
-   The pt's COPD is not stable With/Without: without treatment.  - activity or exercise based on tolerance encouraged.  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 Will discuss Pulmonology Visit for Spirometry in 3 months

## 2019-10-18 NOTE — Assessment & Plan Note (Signed)
-   The patient currently smokes regularly. - change or avoid triggers like smoky places, drinking alcohol and other smokers - drink 4-6 glasses of water each day  Smokes 1ppd not interested in quitting smoking. Not currently followed by Pulmonology.

## 2019-10-18 NOTE — Progress Notes (Addendum)
Established Patient Office Visit  SUBJECTIVE:  Subjective  Patient ID: Carolyn Owens, female    DOB: 06/16/1955  Age: 64 y.o. MRN: [id number]  CC:  Chief Complaint  Patient presents with  . Annual Exam    HPI Carolyn Owens is a 64 y.o. female presenting today for an annual physical.  She notes that when she was diagnosed with COPD, she had been placing insulation under her house without a mask for about an hour; there was also mold. She attributes her COPD to this rather than her smoking. She has not seen pulmonology. She does have an inhaler, that she mostly has to use after she mows her lawn.   Her mammography was last completed on 05/28/2019 and was normal. She has not had a colonoscopy. She continues to smoke around a pack per day. She has had her pneumonia vaccine, shingles vaccine, influenza vaccine, and COVID19 vaccine.    Past Medical History:  Diagnosis Date  . Anxiety   . Chicken pox   . Glaucoma    suspect patient  . History of blood transfusion   . Hyperlipidemia   . Hypertension   . Multiple gastric ulcers     Past Surgical History:  Procedure Laterality Date  . ankle bone infection Right 1965  . HERNIA REPAIR  2005  . PARTIAL HYSTERECTOMY  2004/2005  . VAGINAL HYSTERECTOMY  2004   Has ovaries (no oophorectomy).  . WRIST GANGLION EXCISION Right 1983    Family History  Problem Relation Age of Onset  . Lung cancer Father   . Hyperlipidemia Father   . Hypertension Father   . Heart attack Father   . Hyperlipidemia Mother   . Hypertension Mother   . Pancreatic cancer Mother   . Diabetes Mother   . Multiple myeloma Paternal Grandfather   . Liver cancer Maternal Aunt   . Breast cancer Paternal Aunt   . Brain cancer Maternal Uncle     Social History   Socioeconomic History  . Marital status: Divorced    Spouse name: Not on file  . Number of children: Not on file  . Years of education: Not on file  . Highest education level: Not on file    Occupational History  . Not on file  Tobacco Use  . Smoking status: Current Every Day Smoker    Packs/day: 0.50  . Smokeless tobacco: Never Used  Substance and Sexual Activity  . Alcohol use: Yes    Alcohol/week: 1.0 - 2.0 standard drink    Types: 1 - 2 Standard drinks or equivalent per week  . Drug use: Not on file  . Sexual activity: Not on file  Other Topics Concern  . Not on file  Social History Narrative  . Not on file   Social Determinants of Health   Financial Resource Strain:   . Difficulty of Paying Living Expenses: Not on file  Food Insecurity:   . Worried About Charity fundraiser in the Last Year: Not on file  . Ran Out of Food in the Last Year: Not on file  Transportation Needs:   . Lack of Transportation (Medical): Not on file  . Lack of Transportation (Non-Medical): Not on file  Physical Activity:   . Days of Exercise per Week: Not on file  . Minutes of Exercise per Session: Not on file  Stress:   . Feeling of Stress : Not on file  Social Connections:   . Frequency of Communication with Friends  and Family: Not on file  . Frequency of Social Gatherings with Friends and Family: Not on file  . Attends Religious Services: Not on file  . Active Member of Clubs or Organizations: Not on file  . Attends Archivist Meetings: Not on file  . Marital Status: Not on file  Intimate Partner Violence:   . Fear of Current or Ex-Partner: Not on file  . Emotionally Abused: Not on file  . Physically Abused: Not on file  . Sexually Abused: Not on file     Current Outpatient Medications:  .  cetirizine (ZYRTEC) 10 MG tablet, Take 10 mg by mouth daily., Disp: , Rfl: 3 .  fluticasone (FLONASE) 50 MCG/ACT nasal spray, SPRAY 2 SPRAYS IN EACH NOSTRIL EVERY DAY, Disp: , Rfl: 4 .  Multiple Vitamin (MULTIVITAMIN) tablet, Take 1 tablet by mouth daily., Disp: , Rfl:  .  omeprazole (PRILOSEC) 20 MG capsule, Take 20 mg by mouth 2 (two) times daily., Disp: , Rfl: 3 .   PROAIR HFA 108 (90 Base) MCG/ACT inhaler, Place 2 puffs into the nose as needed., Disp: , Rfl: 1 .  sertraline (ZOLOFT) 50 MG tablet, Take 50 mg by mouth daily., Disp: , Rfl: 4 .  simvastatin (ZOCOR) 40 MG tablet, TAKE 1 TABLET (40 MG) BY ORAL ROUTE ONCE DAILY IN THE EVENING, Disp: , Rfl: 3 .  valsartan-hydrochlorothiazide (DIOVAN-HCT) 160-12.5 MG tablet, Take 1 tablet by mouth daily., Disp: , Rfl: 3   Allergies  Allergen Reactions  . Adhesive [Tape]   . Bc Powder [Aspirin-Salicylamide-Caffeine]   . Sulfur     ROS Review of Systems  Constitutional: Negative.   HENT: Negative.   Eyes: Negative.   Respiratory: Positive for wheezing (occasional, worse in mornings). Negative for chest tightness and shortness of breath.   Cardiovascular: Negative.  Negative for chest pain.  Gastrointestinal: Positive for abdominal pain (hernia).  Endocrine: Negative.   Genitourinary: Negative.   Musculoskeletal: Negative.   Skin: Negative.   Allergic/Immunologic: Negative.   Neurological: Negative.   Hematological: Negative.   Psychiatric/Behavioral: Negative.   All other systems reviewed and are negative.    OBJECTIVE:    Physical Exam Vitals reviewed.  Constitutional:      Appearance: Normal appearance.  HENT:     Mouth/Throat:     Mouth: Mucous membranes are moist.  Eyes:     Pupils: Pupils are equal, round, and reactive to light.  Neck:     Vascular: No carotid bruit.  Cardiovascular:     Rate and Rhythm: Normal rate and regular rhythm.     Pulses: Normal pulses.     Heart sounds: Normal heart sounds.  Pulmonary:     Effort: Pulmonary effort is normal.     Breath sounds: Wheezing (mild) present.  Abdominal:     General: Bowel sounds are normal.     Palpations: Abdomen is soft. There is no hepatomegaly, splenomegaly or mass.     Tenderness: There is no abdominal tenderness.  Musculoskeletal:        General: No tenderness.     Cervical back: Neck supple.     Right lower leg: No  edema.     Left lower leg: No edema.  Skin:    Findings: No rash.  Neurological:     Mental Status: She is alert and oriented to person, place, and time.     Motor: No weakness.  Psychiatric:        Mood and Affect: Mood and affect normal.  Behavior: Behavior normal.     BP 129/74   Pulse 91   Ht 5' (1.524 m)   Wt 126 lb 9.6 oz (57.4 kg)   BMI 24.72 kg/m  Wt Readings from Last 3 Encounters:  10/18/19 126 lb 9.6 oz (57.4 kg)  08/13/19 127 lb 3.2 oz (57.7 kg)  01/05/17 137 lb (62.1 kg)    Health Maintenance Due  Topic Date Due  . Hepatitis C Screening  Never done  . COLONOSCOPY  Never done  . PAP SMEAR-Modifier  04/02/2017  . TETANUS/TDAP  02/02/2018  . INFLUENZA VACCINE  08/05/2019    There are no preventive care reminders to display for this patient.  CBC Latest Ref Rng & Units 12/03/2016 12/02/2016 11/30/2016  WBC 3.6 - 11.0 K/uL 11.6(H) 10.6 14.7(H)  Hemoglobin 12.0 - 16.0 g/dL 13.9 14.3 13.4  Hematocrit 35 - 47 % 41.2 41.7 39.3  Platelets 150 - 440 K/uL 289 264 222   CMP Latest Ref Rng & Units 12/03/2016 12/03/2016 12/02/2016  Glucose 65 - 99 mg/dL - 138(H) 171(H)  BUN 6 - 20 mg/dL - 16 17  Creatinine 0.44 - 1.00 mg/dL - 0.63 0.51  Sodium 135 - 145 mmol/L - 140 138  Potassium 3.5 - 5.1 mmol/L 3.7 2.7(LL) 3.6  Chloride 101 - 111 mmol/L - 100(L) 101  CO2 22 - 32 mmol/L - 27 27  Calcium 8.9 - 10.3 mg/dL - 8.6(L) 8.6(L)  Total Protein 6.5 - 8.1 g/dL - - -  Total Bilirubin 0.3 - 1.2 mg/dL - - -  Alkaline Phos 38 - 126 U/L - - -  AST 15 - 41 U/L - - -  ALT 14 - 54 U/L - - -    Lab Results  Component Value Date   TSH 0.88 02/25/2014   Lab Results  Component Value Date   ALBUMIN 4.0 11/29/2016   ANIONGAP 13 12/03/2016   Lab Results  Component Value Date   CHOL 193 02/25/2014   HDL 60 02/25/2014   LDLCALC 101 02/25/2014   Lab Results  Component Value Date   TRIG 158 02/25/2014   Lab Results  Component Value Date   HGBA1C 6.0 02/25/2014       ASSESSMENT & PLAN:   Problem List Items Addressed This Visit      Cardiovascular and Mediastinum   HTN (hypertension)    Patient's blood pressure is within the desired range. Medication side effects include: no side effects noted Continue current treatment regimen. Regular aerobic exercise. No recent CP or leg swelling          Respiratory   COPD (chronic obstructive pulmonary disease) ([place])    - The pt's COPD is not stable With/Without: without treatment.  - activity or exercise based on tolerance encouraged.  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 Will discuss Pulmonology Visit for Spirometry in 3 months         Other   Tobacco abuse - Primary    - The patient currently smokes regularly. - change or avoid triggers like smoky places, drinking alcohol and other smokers - drink 4-6 glasses of water each day  Smokes 1ppd not interested in quitting smoking. Not currently followed by Pulmonology.        Relevant Orders   CT CHEST LUNG CA SCREEN LOW DOSE W/O CM   Annual physical exam    64 yo female her for physical, No hx of  CT chest for Lung Ca screening, Last ECG 2018-No recent CP but her dad had MI in early 60's, Needs colon screening- will consider Colonoscopy vs Cologard.       Relevant Orders   EKG 12-Lead (Completed)   CBC with Differential/Platelet   Hemoglobin A1c   Lipid panel   EKG 12-Lead   CT CHEST LUNG CA SCREEN LOW DOSE W/O CM      No orders of the defined types were placed in this encounter.   Follow-up: No follow-ups on file.    EKG: Sinus rhythm, indeterminate access, RSR (QR) in V1/V2 consistent with right ventricular conduction delay, borderline EKG.  EKG findings will be reviewed with Dr. Lavera Guise.    Carolyn Owens, [place] [address] [place],  [address]   By signing my name below, I, [employer], attest that this documentation  has been prepared under the direction and in the presence of Carolyn Salts, FNP Electronically Signed: Beckie Owens, [place] 11/02/19, 2:57 PM  I personally performed the services described in this documentation, which was SCRIBED in my presence. The recorded information has been reviewed and considered accurate. It has been edited as necessary during review. Carolyn Salts, FNP

## 2019-10-18 NOTE — Assessment & Plan Note (Addendum)
64 yo female her for physical, No hx of CT chest for Lung Ca screening, Last ECG 2018-No recent CP but her dad had MI in early 60's, Needs colon screening- will consider Colonoscopy vs Cologard.

## 2019-10-18 NOTE — Assessment & Plan Note (Signed)
Patient's blood pressure is within the desired range. Medication side effects include: no side effects noted Continue current treatment regimen. Regular aerobic exercise. No recent CP or leg swelling

## 2019-11-02 NOTE — Addendum Note (Signed)
Addended by: Irish Lack on: 11/02/2019 02:59 PM   Modules accepted: Orders

## 2019-12-21 DIAGNOSIS — Z0189 Encounter for other specified special examinations: Secondary | ICD-10-CM | POA: Diagnosis not present

## 2019-12-24 DIAGNOSIS — I10 Essential (primary) hypertension: Secondary | ICD-10-CM | POA: Diagnosis not present

## 2019-12-24 DIAGNOSIS — Z713 Dietary counseling and surveillance: Secondary | ICD-10-CM | POA: Diagnosis not present

## 2019-12-24 DIAGNOSIS — Z043 Encounter for examination and observation following other accident: Secondary | ICD-10-CM | POA: Diagnosis not present

## 2020-01-18 ENCOUNTER — Ambulatory Visit: Payer: BC Managed Care – PPO | Admitting: Family Medicine

## 2020-04-22 ENCOUNTER — Other Ambulatory Visit: Payer: Self-pay | Admitting: Family Medicine

## 2020-04-22 DIAGNOSIS — Z1231 Encounter for screening mammogram for malignant neoplasm of breast: Secondary | ICD-10-CM

## 2020-04-23 DIAGNOSIS — D225 Melanocytic nevi of trunk: Secondary | ICD-10-CM | POA: Diagnosis not present

## 2020-04-23 DIAGNOSIS — D2261 Melanocytic nevi of right upper limb, including shoulder: Secondary | ICD-10-CM | POA: Diagnosis not present

## 2020-04-23 DIAGNOSIS — Z85828 Personal history of other malignant neoplasm of skin: Secondary | ICD-10-CM | POA: Diagnosis not present

## 2020-04-23 DIAGNOSIS — D2262 Melanocytic nevi of left upper limb, including shoulder: Secondary | ICD-10-CM | POA: Diagnosis not present

## 2020-05-15 DIAGNOSIS — H40003 Preglaucoma, unspecified, bilateral: Secondary | ICD-10-CM | POA: Diagnosis not present

## 2020-05-28 ENCOUNTER — Other Ambulatory Visit: Payer: Self-pay

## 2020-05-28 ENCOUNTER — Ambulatory Visit
Admission: RE | Admit: 2020-05-28 | Discharge: 2020-05-28 | Disposition: A | Discharge status: Home / Self Care | Payer: BC Managed Care – PPO | Source: Ambulatory Visit | Attending: Family Medicine | Admitting: Family Medicine

## 2020-05-28 DIAGNOSIS — Z1231 Encounter for screening mammogram for malignant neoplasm of breast: Secondary | ICD-10-CM | POA: Diagnosis not present

## 2020-12-03 DIAGNOSIS — H40003 Preglaucoma, unspecified, bilateral: Secondary | ICD-10-CM | POA: Diagnosis not present

## 2020-12-04 DIAGNOSIS — S93492A Sprain of other ligament of left ankle, initial encounter: Secondary | ICD-10-CM | POA: Diagnosis not present

## 2020-12-04 DIAGNOSIS — M25572 Pain in left ankle and joints of left foot: Secondary | ICD-10-CM | POA: Diagnosis not present

## 2020-12-08 DIAGNOSIS — H40003 Preglaucoma, unspecified, bilateral: Secondary | ICD-10-CM | POA: Diagnosis not present

## 2020-12-23 DIAGNOSIS — Z0189 Encounter for other specified special examinations: Secondary | ICD-10-CM | POA: Diagnosis not present

## 2020-12-24 DIAGNOSIS — Z713 Dietary counseling and surveillance: Secondary | ICD-10-CM | POA: Diagnosis not present

## 2020-12-24 DIAGNOSIS — I10 Essential (primary) hypertension: Secondary | ICD-10-CM | POA: Diagnosis not present

## 2020-12-24 DIAGNOSIS — Z043 Encounter for examination and observation following other accident: Secondary | ICD-10-CM | POA: Diagnosis not present

## 2021-03-26 DIAGNOSIS — H00019 Hordeolum externum unspecified eye, unspecified eyelid: Secondary | ICD-10-CM | POA: Diagnosis not present

## 2021-04-29 ENCOUNTER — Other Ambulatory Visit: Payer: Self-pay | Admitting: Family Medicine

## 2021-04-29 DIAGNOSIS — Z1231 Encounter for screening mammogram for malignant neoplasm of breast: Secondary | ICD-10-CM

## 2021-05-01 DIAGNOSIS — D2262 Melanocytic nevi of left upper limb, including shoulder: Secondary | ICD-10-CM | POA: Diagnosis not present

## 2021-05-01 DIAGNOSIS — D2272 Melanocytic nevi of left lower limb, including hip: Secondary | ICD-10-CM | POA: Diagnosis not present

## 2021-05-01 DIAGNOSIS — D2261 Melanocytic nevi of right upper limb, including shoulder: Secondary | ICD-10-CM | POA: Diagnosis not present

## 2021-05-01 DIAGNOSIS — Z85828 Personal history of other malignant neoplasm of skin: Secondary | ICD-10-CM | POA: Diagnosis not present

## 2021-05-11 DIAGNOSIS — Z043 Encounter for examination and observation following other accident: Secondary | ICD-10-CM | POA: Diagnosis not present

## 2021-05-18 DIAGNOSIS — Z7189 Other specified counseling: Secondary | ICD-10-CM | POA: Diagnosis not present

## 2021-06-15 ENCOUNTER — Ambulatory Visit: Payer: BC Managed Care – PPO

## 2021-06-16 DIAGNOSIS — H40003 Preglaucoma, unspecified, bilateral: Secondary | ICD-10-CM | POA: Diagnosis not present

## 2021-06-28 IMAGING — MG MM DIGITAL SCREENING BILAT W/ TOMO AND CAD
8 series · 8 of 24 positions shown · non-contrast
Comparison: Previous exam(s).

CLINICAL DATA: Screening.

EXAM:
DIGITAL SCREENING BILATERAL MAMMOGRAM WITH TOMOSYNTHESIS AND CAD
TECHNIQUE: Bilateral screening digital craniocaudal and mediolateral oblique
mammograms were obtained. Bilateral screening digital breast
tomosynthesis was performed. The images were evaluated with
computer-aided detection.

[L MLO synth-2D]
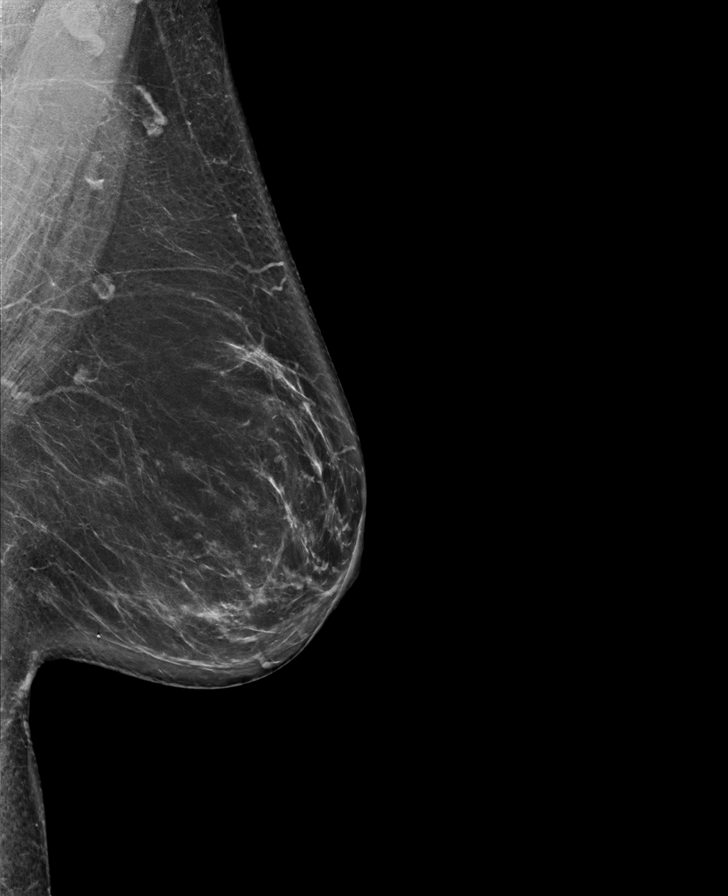

[L CC synth-2D]
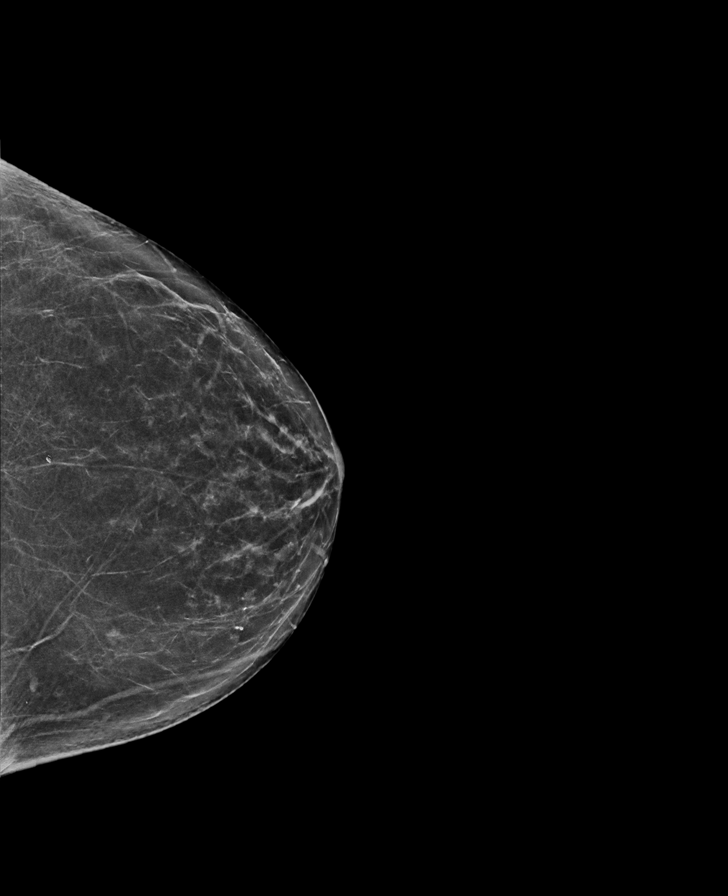

[R CC synth-2D]
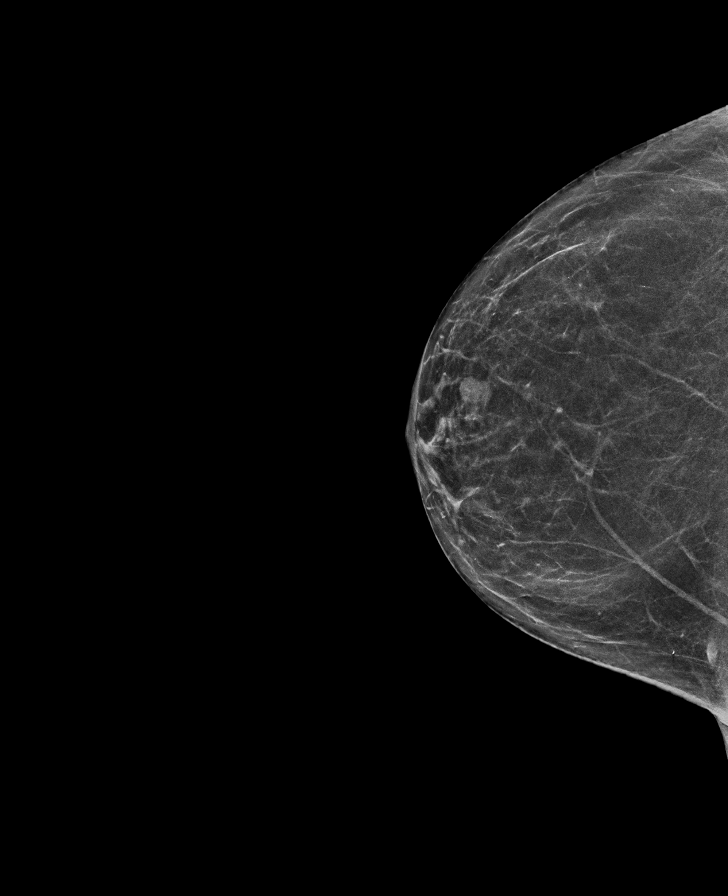

[R MLO synth-2D]
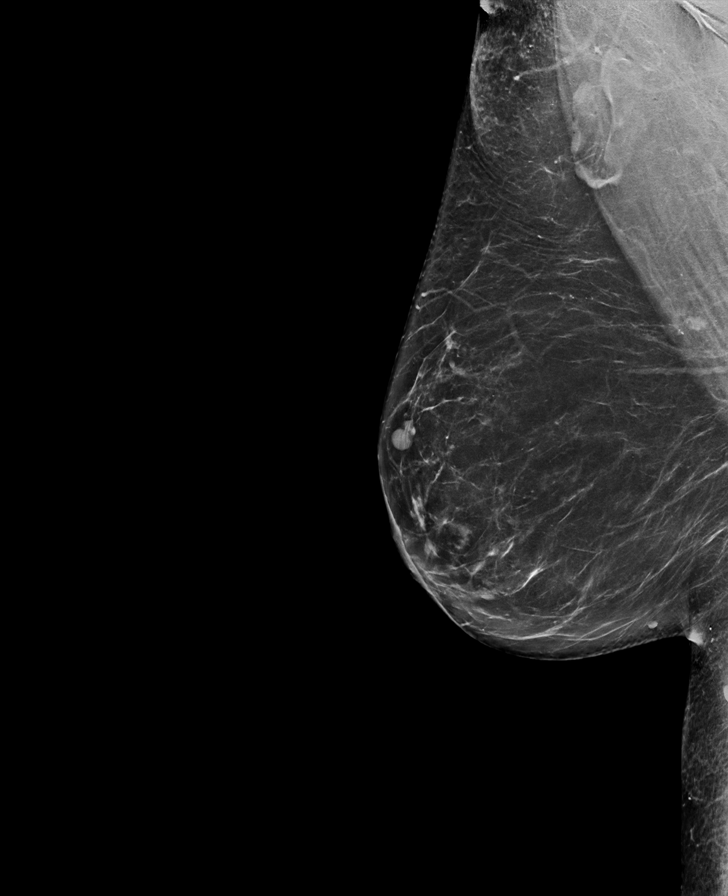

[L CC tomo · tomo slice 35/70.0]
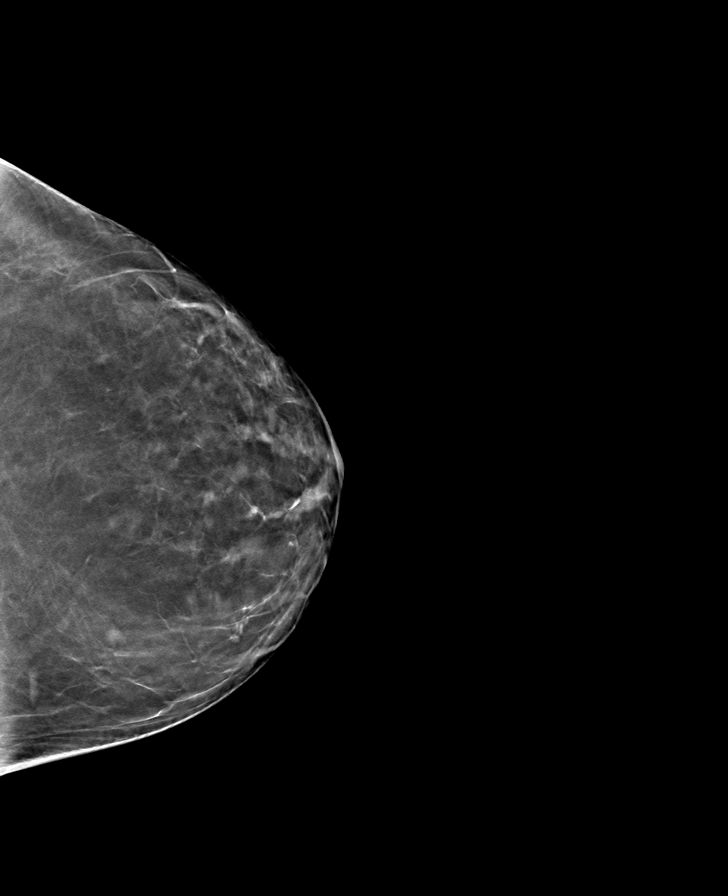

[R MLO tomo · tomo slice 44/87.0]
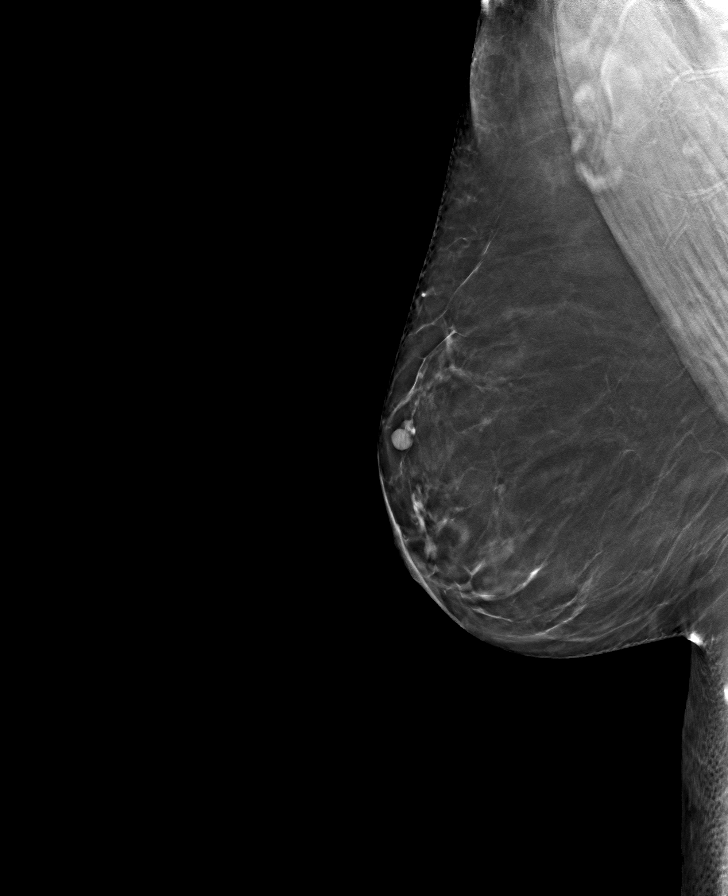

[R CC tomo · tomo slice 29/56.0]
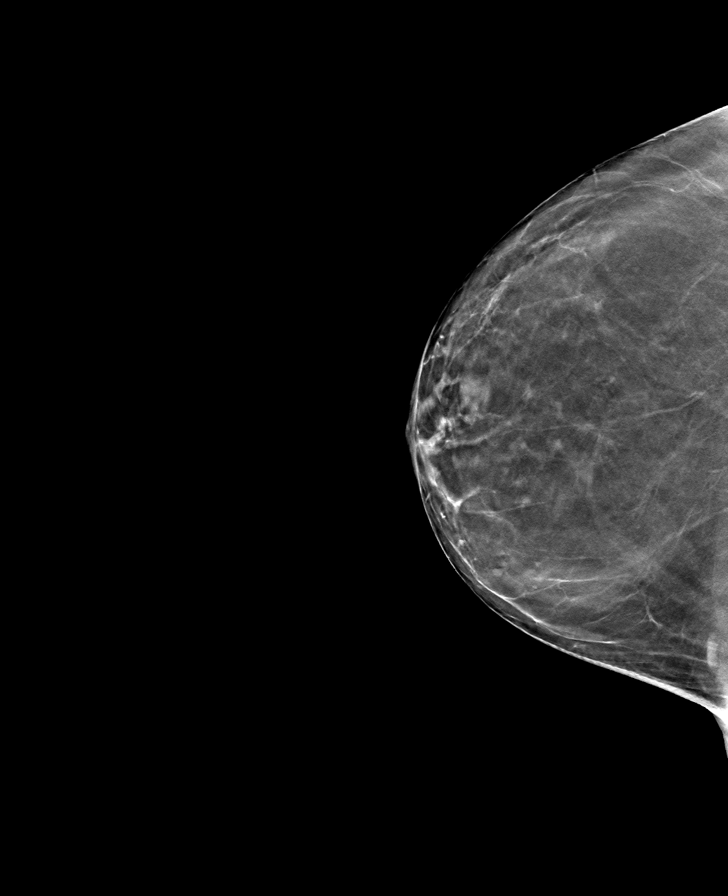

[L MLO tomo · tomo slice 43/86.0]
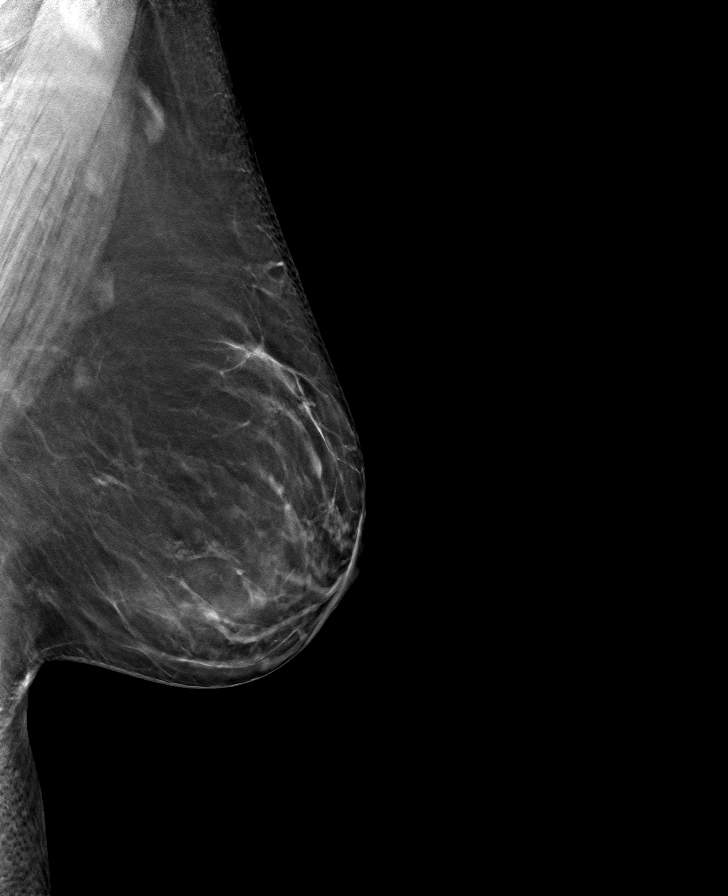

[8 of 24 positions shown; findings below may reference images not displayed]

ACR Breast Density Category b: There are scattered areas of
fibroglandular density.
FINDINGS: There are no findings suspicious for malignancy. The images were
evaluated with computer-aided detection.
IMPRESSION: No mammographic evidence of malignancy. A result letter of this
screening mammogram will be mailed directly to the patient.

RECOMMENDATION:
Screening mammogram in one year. (Code:WJ-I-BG6)

BI-RADS CATEGORY  1: Negative.

## 2021-07-02 ENCOUNTER — Ambulatory Visit
Admission: RE | Admit: 2021-07-02 | Discharge: 2021-07-02 | Disposition: A | Discharge status: Home / Self Care | Payer: BC Managed Care – PPO | Source: Ambulatory Visit | Attending: Family Medicine | Admitting: Family Medicine

## 2021-07-02 DIAGNOSIS — Z1231 Encounter for screening mammogram for malignant neoplasm of breast: Secondary | ICD-10-CM | POA: Insufficient documentation

## 2022-06-03 ENCOUNTER — Other Ambulatory Visit: Payer: Self-pay | Admitting: Family Medicine

## 2022-06-03 DIAGNOSIS — Z1231 Encounter for screening mammogram for malignant neoplasm of breast: Secondary | ICD-10-CM

## 2022-07-05 ENCOUNTER — Ambulatory Visit
Admission: RE | Admit: 2022-07-05 | Discharge: 2022-07-05 | Disposition: A | Discharge status: Home / Self Care | Payer: BC Managed Care – PPO | Source: Ambulatory Visit | Attending: Family Medicine | Admitting: Family Medicine

## 2022-07-05 DIAGNOSIS — Z1231 Encounter for screening mammogram for malignant neoplasm of breast: Secondary | ICD-10-CM

## 2022-10-29 DIAGNOSIS — D2361 Other benign neoplasm of skin of right upper limb, including shoulder: Secondary | ICD-10-CM | POA: Diagnosis not present

## 2022-10-29 DIAGNOSIS — L821 Other seborrheic keratosis: Secondary | ICD-10-CM | POA: Diagnosis not present

## 2022-10-29 DIAGNOSIS — Z09 Encounter for follow-up examination after completed treatment for conditions other than malignant neoplasm: Secondary | ICD-10-CM | POA: Diagnosis not present

## 2022-10-29 DIAGNOSIS — Z85828 Personal history of other malignant neoplasm of skin: Secondary | ICD-10-CM | POA: Diagnosis not present

## 2022-10-29 DIAGNOSIS — Z872 Personal history of diseases of the skin and subcutaneous tissue: Secondary | ICD-10-CM | POA: Diagnosis not present

## 2022-10-29 DIAGNOSIS — L858 Other specified epidermal thickening: Secondary | ICD-10-CM | POA: Diagnosis not present

## 2022-10-29 DIAGNOSIS — Z08 Encounter for follow-up examination after completed treatment for malignant neoplasm: Secondary | ICD-10-CM | POA: Diagnosis not present

## 2022-10-29 DIAGNOSIS — B078 Other viral warts: Secondary | ICD-10-CM | POA: Diagnosis not present

## 2023-03-17 ENCOUNTER — Other Ambulatory Visit: Payer: Self-pay | Admitting: General Practice

## 2023-03-17 DIAGNOSIS — Z1231 Encounter for screening mammogram for malignant neoplasm of breast: Secondary | ICD-10-CM

## 2023-03-17 DIAGNOSIS — Z780 Asymptomatic menopausal state: Secondary | ICD-10-CM

## 2023-06-20 ENCOUNTER — Encounter: Payer: Self-pay | Admitting: Emergency Medicine

## 2023-07-06 ENCOUNTER — Ambulatory Visit
Admission: RE | Admit: 2023-07-06 | Discharge: 2023-07-06 | Disposition: A | Discharge status: Home / Self Care | Source: Ambulatory Visit | Attending: General Practice | Admitting: General Practice

## 2023-07-06 DIAGNOSIS — Z780 Asymptomatic menopausal state: Secondary | ICD-10-CM | POA: Insufficient documentation

## 2023-07-06 DIAGNOSIS — Z1231 Encounter for screening mammogram for malignant neoplasm of breast: Secondary | ICD-10-CM | POA: Diagnosis present

## 2023-08-25 ENCOUNTER — Encounter: Payer: Self-pay | Admitting: Emergency Medicine
